# Patient Record
Sex: Male | Born: 1975 | Race: White | Hispanic: No | Marital: Married | Smoking: Current every day smoker
Health system: Southern US, Community
[De-identification: ages and names within clinical notes are randomized; demographics above are authoritative.]

---

## 2016-01-23 ENCOUNTER — Encounter: Payer: Self-pay | Admitting: Emergency Medicine

## 2016-01-23 ENCOUNTER — Emergency Department
Admission: EM | Admit: 2016-01-23 | Discharge: 2016-01-24 | Disposition: A | Discharge status: Home / Self Care | Payer: Self-pay | Attending: Emergency Medicine | Admitting: Emergency Medicine

## 2016-01-23 DIAGNOSIS — N390 Urinary tract infection, site not specified: Secondary | ICD-10-CM | POA: Insufficient documentation

## 2016-01-23 DIAGNOSIS — F17210 Nicotine dependence, cigarettes, uncomplicated: Secondary | ICD-10-CM | POA: Insufficient documentation

## 2016-01-23 DIAGNOSIS — N200 Calculus of kidney: Secondary | ICD-10-CM | POA: Insufficient documentation

## 2016-01-23 DIAGNOSIS — R109 Unspecified abdominal pain: Secondary | ICD-10-CM

## 2016-01-23 MED ORDER — HYDROMORPHONE HCL 1 MG/ML IJ SOLN
1.0000 mg | Freq: Once | INTRAMUSCULAR | Status: AC
  Administered 2016-01-24: 1 mg via INTRAVENOUS
  Filled 2016-01-23: qty 1

## 2016-01-23 MED ORDER — SODIUM CHLORIDE 0.9 % IV BOLUS (SEPSIS)
1000.0000 mL | Freq: Once | INTRAVENOUS | Status: AC
  Administered 2016-01-24: 1000 mL via INTRAVENOUS

## 2016-01-23 MED ORDER — ONDANSETRON HCL 4 MG/2ML IJ SOLN
4.0000 mg | Freq: Once | INTRAMUSCULAR | Status: AC
  Administered 2016-01-24: 4 mg via INTRAVENOUS
  Filled 2016-01-23: qty 2

## 2016-01-23 NOTE — ED Notes (Signed)
Patient here for severe abdominal pain. Difficulty ambulating due to pain.

## 2016-01-24 ENCOUNTER — Emergency Department: Payer: Self-pay

## 2016-01-24 LAB — CBC WITH DIFFERENTIAL/PLATELET
Basophils Absolute: 0 10*3/uL (ref 0–0.1)
Basophils Relative: 0 %
EOS PCT: 1 %
Eosinophils Absolute: 0.1 10*3/uL (ref 0–0.7)
HCT: 48.2 % (ref 40.0–52.0)
Hemoglobin: 16.9 g/dL (ref 13.0–18.0)
LYMPHS ABS: 3 10*3/uL (ref 1.0–3.6)
LYMPHS PCT: 28 %
MCH: 31 pg (ref 26.0–34.0)
MCHC: 35 g/dL (ref 32.0–36.0)
MCV: 88.4 fL (ref 80.0–100.0)
MONO ABS: 1 10*3/uL (ref 0.2–1.0)
MONOS PCT: 9 %
Neutro Abs: 6.7 10*3/uL — ABNORMAL HIGH (ref 1.4–6.5)
Neutrophils Relative %: 62 %
PLATELETS: 215 10*3/uL (ref 150–440)
RBC: 5.45 MIL/uL (ref 4.40–5.90)
RDW: 13.3 % (ref 11.5–14.5)
WBC: 10.8 10*3/uL — AB (ref 3.8–10.6)

## 2016-01-24 LAB — BASIC METABOLIC PANEL
Anion gap: 8 (ref 5–15)
BUN: 16 mg/dL (ref 6–20)
CO2: 23 mmol/L (ref 22–32)
CREATININE: 1.12 mg/dL (ref 0.61–1.24)
Calcium: 9.1 mg/dL (ref 8.9–10.3)
Chloride: 108 mmol/L (ref 101–111)
GFR calc Af Amer: >60 mL/min (ref >60)
GLUCOSE: 122 mg/dL — AB (ref 65–99)
POTASSIUM: 3.5 mmol/L (ref 3.5–5.1)
Sodium: 139 mmol/L (ref 135–145)

## 2016-01-24 LAB — URINALYSIS COMPLETE WITH MICROSCOPIC (ARMC ONLY)
Bacteria, UA: NONE SEEN
Bilirubin Urine: NEGATIVE
Glucose, UA: NEGATIVE mg/dL
Nitrite: NEGATIVE
PH: 5 (ref 5.0–8.0)
PROTEIN: NEGATIVE mg/dL
SQUAMOUS EPITHELIAL / LPF: NONE SEEN
Specific Gravity, Urine: 1.027 (ref 1.005–1.030)

## 2016-01-24 IMAGING — CT CT RENAL STONE PROTOCOL
3 of 4 series · 9 of 46 positions shown, 16 images · non-contrast
Comparison: None.

CLINICAL DATA: Acute onset of left flank pain, hematuria and
difficulty urinating. Initial encounter.

EXAM:
CT ABDOMEN AND PELVIS WITHOUT CONTRAST
TECHNIQUE: Multidetector CT imaging of the abdomen and pelvis was performed
following the standard protocol without IV contrast.

[Series 4: lung · axial · 0.68mm/px · z∈[-630,-555]mm · 5 of 23 slices shown, 10 images]
[im 4/23  soft-tissue]
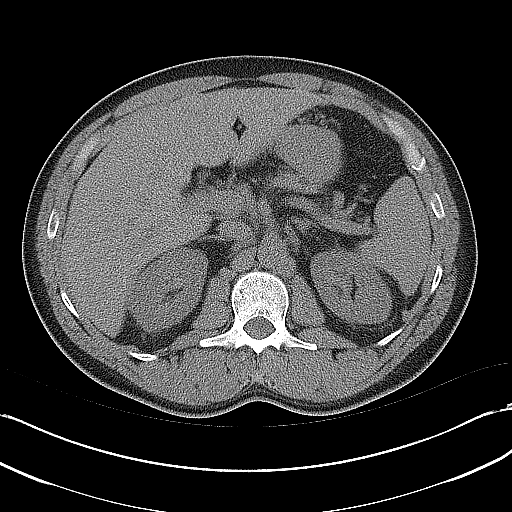
[im 4/23  bone]
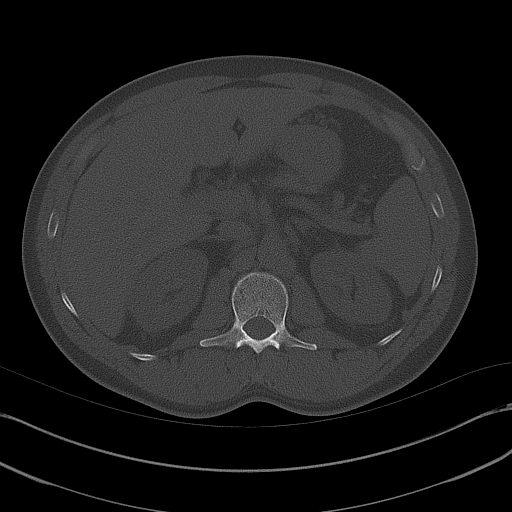
[im 8/23  soft-tissue]
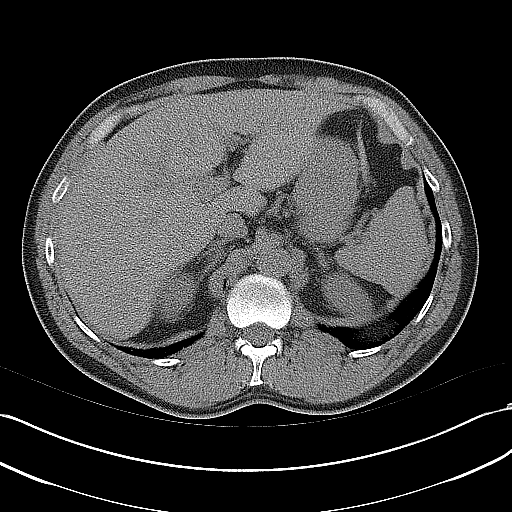
[im 8/23  lung]
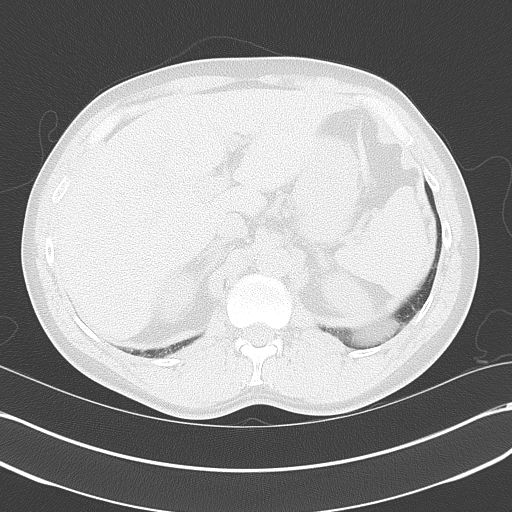
[im 12/23  soft-tissue]
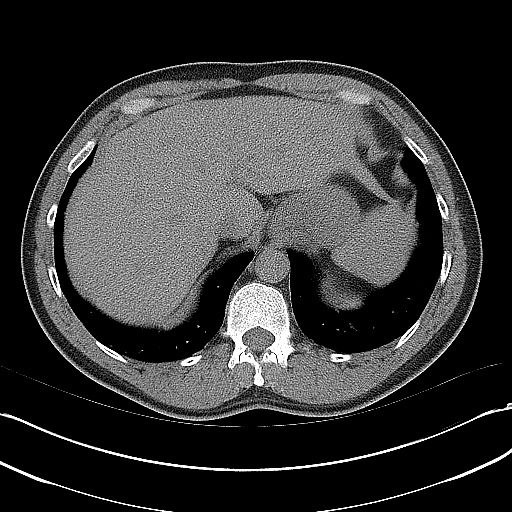
[im 12/23  lung]
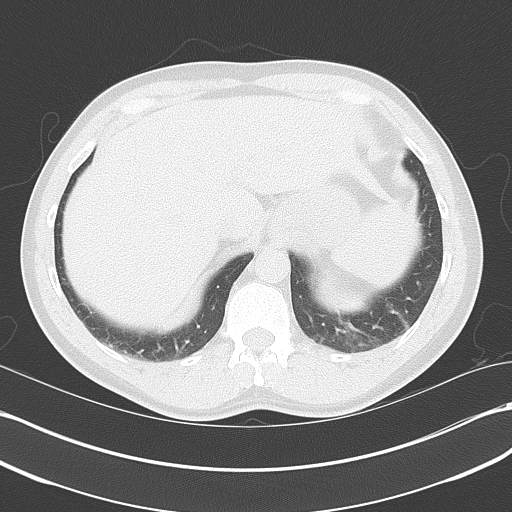
[im 15/23  soft-tissue]
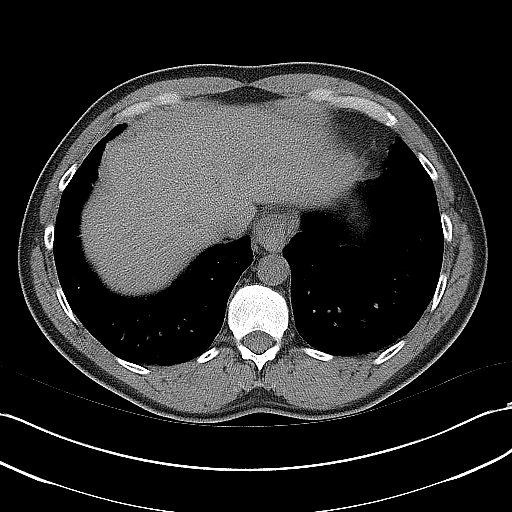
[im 15/23  lung]
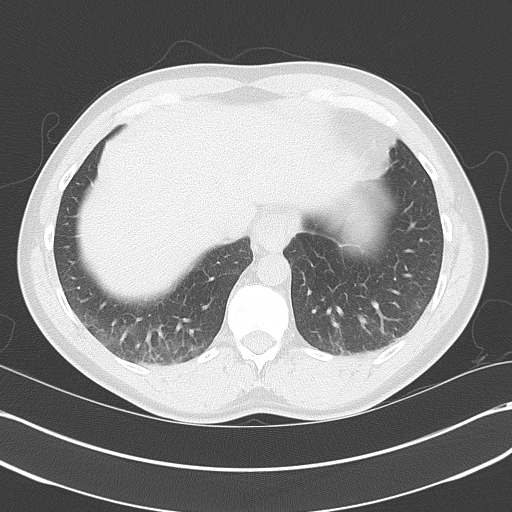
[im 19/23  soft-tissue]
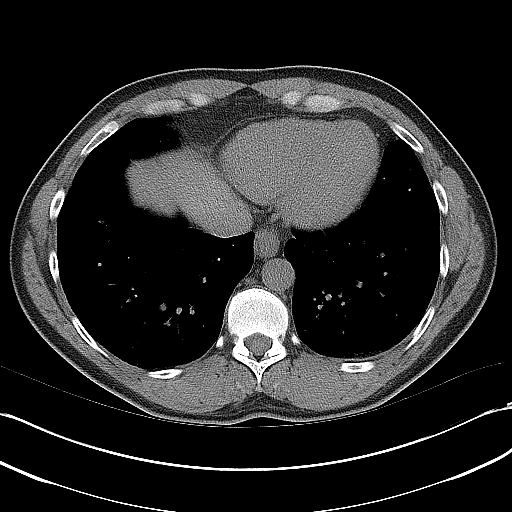
[im 19/23  lung]
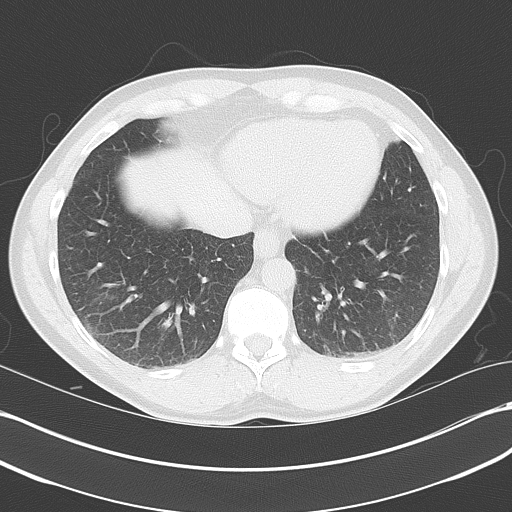

[Series 5: coronal · coronal · 0.71mm/px · 3 of 125 slices shown, 4 images]
[im 42/125  soft-tissue]
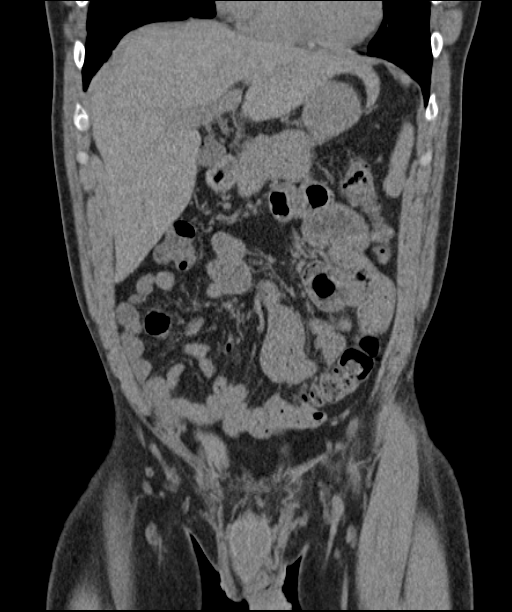
[im 56/125  soft-tissue]
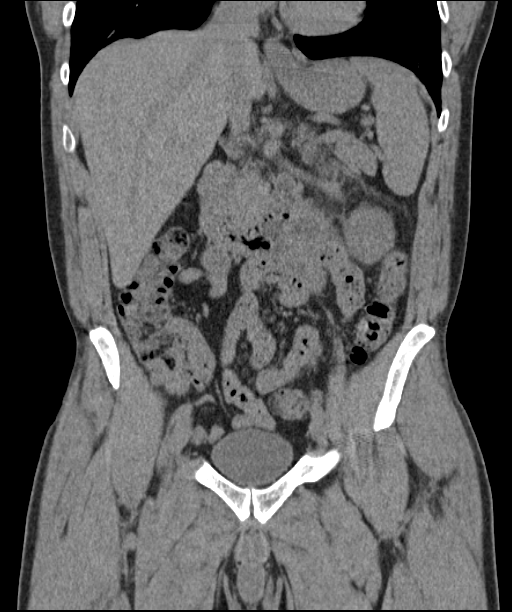
[im 56/125  bone]
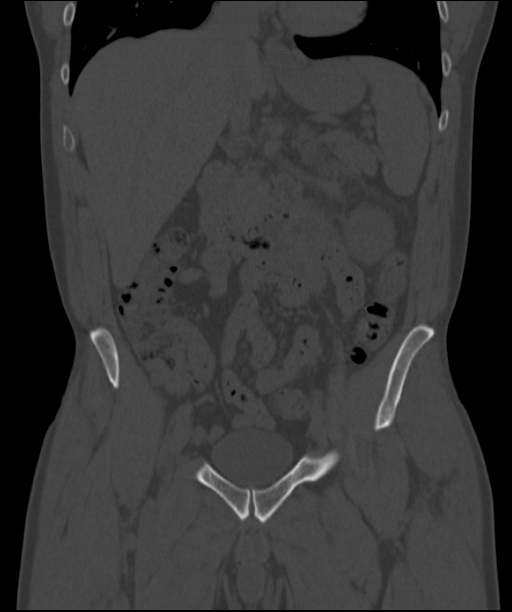
[im 69/125  soft-tissue]
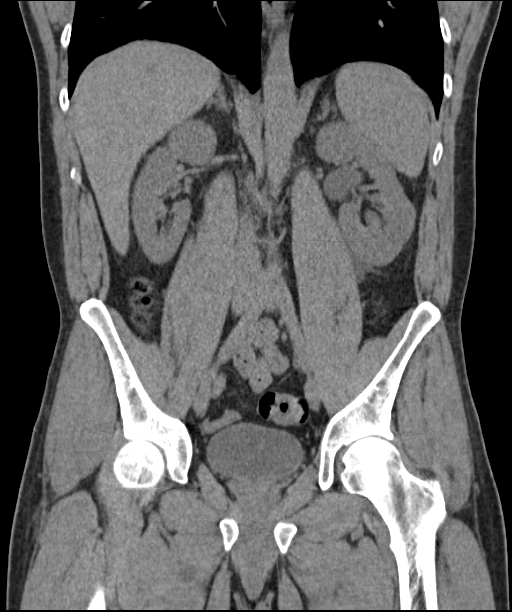

[Series 6: sagittal · sagittal · 0.54mm/px · 1 of 169 slices shown, 2 images]
[im 57/169  soft-tissue]
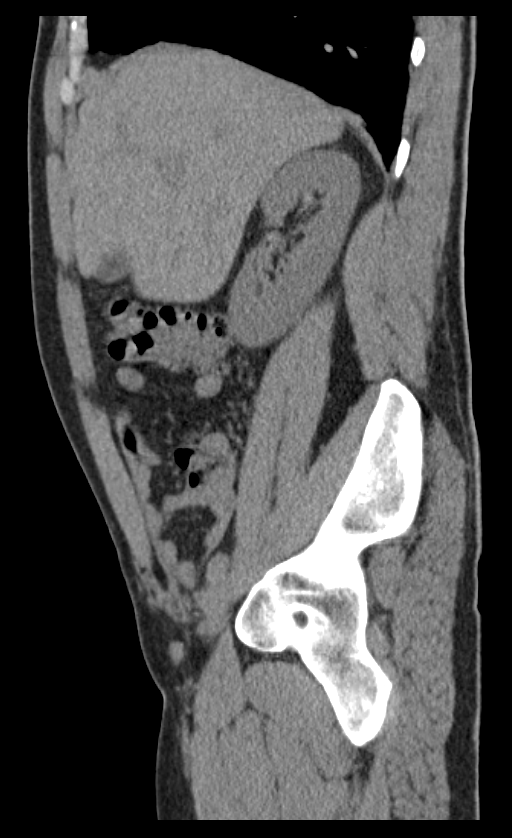
[im 57/169  bone]
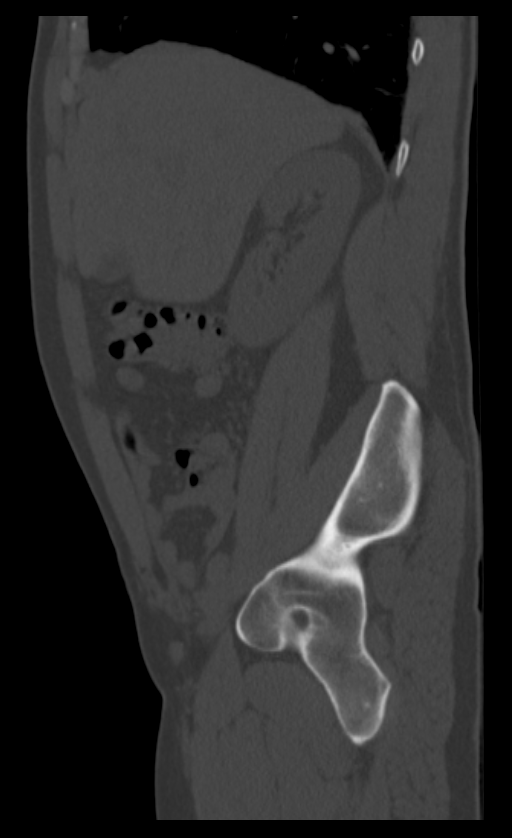

[9 of 46 positions shown; findings below may reference images not displayed]

FINDINGS: Minimal bibasilar atelectasis is noted.

A 5 mm hypodensity is noted within the right hepatic lobe. The liver
and spleen are otherwise unremarkable. The gallbladder is within
normal limits. The pancreas and adrenal glands are unremarkable.

Minimal left-sided hydronephrosis is noted, with prominence of the
left renal pelvis and left ureter, and an obstructing 5 x 3 mm stone
noted distally at the left vesicoureteral junction. Mild nonspecific
perinephric stranding is noted bilaterally. Nonobstructing bilateral
renal stones are seen, measuring up to 6 mm in size.

No free fluid is identified. The small bowel is unremarkable in
appearance. The stomach is within normal limits. No acute vascular
abnormalities are seen.

The appendix is normal in caliber, without evidence of appendicitis.
The colon is grossly unremarkable in appearance.

The bladder is moderately distended and grossly unremarkable. The
prostate remains normal in size. No inguinal lymphadenopathy is
seen.

No acute osseous abnormalities are identified.
IMPRESSION: 1. Minimal left-sided hydronephrosis, with an obstructing 5 x 3 mm
stone noted distally at the left vesicoureteral junction.
2. Nonobstructing bilateral renal stones, measuring up to 6 mm in
size.
3. 5 mm nonspecific hypodensity within the right hepatic lobe.

## 2016-01-24 MED ORDER — KETOROLAC TROMETHAMINE 30 MG/ML IJ SOLN
30.0000 mg | Freq: Once | INTRAMUSCULAR | Status: AC
  Administered 2016-01-24: 30 mg via INTRAVENOUS
  Filled 2016-01-24: qty 1

## 2016-01-24 MED ORDER — CIPROFLOXACIN HCL 500 MG PO TABS
500.0000 mg | ORAL_TABLET | Freq: Once | ORAL | Status: AC
  Administered 2016-01-24: 500 mg via ORAL
  Filled 2016-01-24: qty 1

## 2016-01-24 MED ORDER — SODIUM CHLORIDE 0.9 % IV BOLUS (SEPSIS)
1000.0000 mL | Freq: Once | INTRAVENOUS | Status: AC
  Administered 2016-01-24: 1000 mL via INTRAVENOUS

## 2016-01-24 MED ORDER — OXYCODONE-ACETAMINOPHEN 5-325 MG PO TABS: 1.0000 | ORAL_TABLET | ORAL | Status: DC | PRN

## 2016-01-24 MED ORDER — CIPROFLOXACIN HCL 500 MG PO TABS: 500.0000 mg | ORAL_TABLET | Freq: Two times a day (BID) | ORAL | Status: DC

## 2016-01-24 MED ORDER — TAMSULOSIN HCL 0.4 MG PO CAPS
0.4000 mg | ORAL_CAPSULE | Freq: Once | ORAL | Status: AC
  Administered 2016-01-24: 0.4 mg via ORAL
  Filled 2016-01-24: qty 1

## 2016-01-24 MED ORDER — HYDROMORPHONE HCL 1 MG/ML IJ SOLN
1.0000 mg | Freq: Once | INTRAMUSCULAR | Status: AC
  Administered 2016-01-24: 1 mg via INTRAVENOUS

## 2016-01-24 MED ORDER — ONDANSETRON 4 MG PO TBDP: 4.0000 mg | ORAL_TABLET | Freq: Three times a day (TID) | ORAL | Status: DC | PRN

## 2016-01-24 MED ORDER — HYDROMORPHONE HCL 1 MG/ML IJ SOLN
INTRAMUSCULAR | Status: AC
  Administered 2016-01-24: 1 mg via INTRAVENOUS
  Filled 2016-01-24: qty 1

## 2016-01-24 MED ORDER — TAMSULOSIN HCL 0.4 MG PO CAPS: 0.4000 mg | ORAL_CAPSULE | Freq: Every day | ORAL | Status: DC

## 2016-01-24 NOTE — Discharge Instructions (Signed)
1. Take pain & nausea medicines as needed (Percocet/Zofran #30). Make sure to take a stool softener while taking narcotic pain medicines. 2. Take Flomax 0.4mg  daily x 14 days. 3. Take antibiotic as prescribed (Cipro 500 mg twice daily 7 days). 4. Drink plenty of bottled or filtered water daily. 5. Return to the ER for worsening symptoms, persistent vomiting, fever, difficulty breathing or other concerns.  Flank Pain Flank pain refers to pain that is located on the side of the body between the upper abdomen and the back. The pain may occur over a short period of time (acute) or may be long-term or reoccurring (chronic). It may be mild or severe. Flank pain can be caused by many things. CAUSES  Some of the more common causes of flank pain include:  Muscle strains.   Muscle spasms.   A disease of your spine (vertebral disk disease).   A lung infection (pneumonia).   Fluid around your lungs (pulmonary edema).   A kidney infection.   Kidney stones.   A very painful skin rash caused by the chickenpox virus (shingles).   Gallbladder disease.  HOME CARE INSTRUCTIONS  Home care will depend on the cause of your pain. In general,  Rest as directed by your caregiver.  Drink enough fluids to keep your urine clear or pale yellow.  Only take over-the-counter or prescription medicines as directed by your caregiver. Some medicines may help relieve the pain.  Tell your caregiver about any changes in your pain.  Follow up with your caregiver as directed. SEEK IMMEDIATE MEDICAL CARE IF:   Your pain is not controlled with medicine.   You have new or worsening symptoms.  Your pain increases.   You have abdominal pain.   You have shortness of breath.   You have persistent nausea or vomiting.   You have swelling in your abdomen.   You feel faint or pass out.   You have blood in your urine.  You have a fever or persistent symptoms for more than 2-3 days.  You  have a fever and your symptoms suddenly get worse. MAKE SURE YOU:   Understand these instructions.  Will watch your condition.  Will get help right away if you are not doing well or get worse.   This information is not intended to replace advice given to you by your health care provider. Make sure you discuss any questions you have with your health care provider.   Document Released: 09/19/2005 Document Revised: 04/22/2012 Document Reviewed: 03/12/2012 Elsevier Interactive Patient Education 2016 Elsevier Inc[employer]  Kidney Stones Kidney stones (urolithiasis) are deposits that form inside your kidneys. The intense pain is caused by the stone moving through the urinary tract. When the stone moves, the ureter goes into spasm around the stone. The stone is usually passed in the urine.  CAUSES   A disorder that makes certain neck glands produce too much parathyroid hormone (primary hyperparathyroidism).  A buildup of uric acid crystals, similar to gout in your joints.  Narrowing (stricture) of the ureter.  A kidney obstruction present at birth (congenital obstruction).  Previous surgery on the kidney or ureters.  Numerous kidney infections. SYMPTOMS   Feeling sick to your stomach (nauseous).  Throwing up (vomiting).  Blood in the urine (hematuria).  Pain that usually spreads (radiates) to the groin.  Frequency or urgency of urination. DIAGNOSIS   Taking a history and physical exam.  Blood or urine tests.  CT scan.  Occasionally, an examination of the inside  of the urinary bladder (cystoscopy) is performed. TREATMENT   Observation.  Increasing your fluid intake.  Extracorporeal shock wave lithotripsy--This is a noninvasive procedure that uses shock waves to break up kidney stones.  Surgery may be needed if you have severe pain or persistent obstruction. There are various surgical procedures. Most of the procedures are performed with the use of small instruments. Only  small incisions are needed to accommodate these instruments, so recovery time is minimized. The size, location, and chemical composition are all important variables that will determine the proper choice of action for you. Talk to your health care provider to better understand your situation so that you will minimize the risk of injury to yourself and your kidney.  HOME CARE INSTRUCTIONS   Drink enough water and fluids to keep your urine clear or pale yellow. This will help you to pass the stone or stone fragments.  Strain all urine through the provided strainer. Keep all particulate matter and stones for your health care provider to see. The stone causing the pain may be as small as a grain of salt. It is very important to use the strainer each and every time you pass your urine. The collection of your stone will allow your health care provider to analyze it and verify that a stone has actually passed. The stone analysis will often identify what you can do to reduce the incidence of recurrences.  Only take over-the-counter or prescription medicines for pain, discomfort, or fever as directed by your health care provider.  Keep all follow-up visits as told by your health care provider. This is important.  Get follow-up X-rays if required. The absence of pain does not always mean that the stone has passed. It may have only stopped moving. If the urine remains completely obstructed, it can cause loss of kidney function or even complete destruction of the kidney. It is your responsibility to make sure X-rays and follow-ups are completed. Ultrasounds of the kidney can show blockages and the status of the kidney. Ultrasounds are not associated with any radiation and can be performed easily in a matter of minutes.  Make changes to your daily diet as told by your health care provider. You may be told to:  Limit the amount of salt that you eat.  Eat 5 or more servings of fruits and vegetables each  day.  Limit the amount of meat, poultry, fish, and eggs that you eat.  Collect a 24-hour urine sample as told by your health care provider.You may need to collect another urine sample every 6-12 months. SEEK MEDICAL CARE IF:  You experience pain that is progressive and unresponsive to any pain medicine you have been prescribed. SEEK IMMEDIATE MEDICAL CARE IF:   Pain cannot be controlled with the prescribed medicine.  You have a fever or shaking chills.  The severity or intensity of pain increases over 18 hours and is not relieved by pain medicine.  You develop a new onset of abdominal pain.  You feel faint or pass out.  You are unable to urinate.   This information is not intended to replace advice given to you by your health care provider. Make sure you discuss any questions you have with your health care provider.   Document Released: 07/29/2005 Document Revised: 04/19/2015 Document Reviewed: 12/30/2012 Elsevier Interactive Patient Education 2016 Elsevier Inc.  Urinary Tract Infection Urinary tract infections (UTIs) can develop anywhere along your urinary tract. Your urinary tract is your body's drainage system for removing wastes  and extra water. Your urinary tract includes two kidneys, two ureters, a bladder, and a urethra. Your kidneys are a pair of bean-shaped organs. Each kidney is about the size of your fist. They are located below your ribs, one on each side of your spine. CAUSES Infections are caused by microbes, which are microscopic organisms, including fungi, viruses, and bacteria. These organisms are so small that they can only be seen through a microscope. Bacteria are the microbes that most commonly cause UTIs. SYMPTOMS  Symptoms of UTIs may vary by age and gender of the patient and by the location of the infection. Symptoms in young women typically include a frequent and intense urge to urinate and a painful, burning feeling in the bladder or urethra during  urination. Older women and men are more likely to be tired, shaky, and weak and have muscle aches and abdominal pain. A fever may mean the infection is in your kidneys. Other symptoms of a kidney infection include pain in your back or sides below the ribs, nausea, and vomiting. DIAGNOSIS To diagnose a UTI, your caregiver will ask you about your symptoms. Your caregiver will also ask you to provide a urine sample. The urine sample will be tested for bacteria and white blood cells. White blood cells are made by your body to help fight infection. TREATMENT  Typically, UTIs can be treated with medication. Because most UTIs are caused by a bacterial infection, they usually can be treated with the use of antibiotics. The choice of antibiotic and length of treatment depend on your symptoms and the type of bacteria causing your infection. HOME CARE INSTRUCTIONS  If you were prescribed antibiotics, take them exactly as your caregiver instructs you. Finish the medication even if you feel better after you have only taken some of the medication.  Drink enough water and fluids to keep your urine clear or pale yellow.  Avoid caffeine, tea, and carbonated beverages. They tend to irritate your bladder.  Empty your bladder often. Avoid holding urine for long periods of time.  Empty your bladder before and after sexual intercourse.  After a bowel movement, women should cleanse from front to back. Use each tissue only once. SEEK MEDICAL CARE IF:   You have back pain.  You develop a fever.  Your symptoms do not begin to resolve within 3 days. SEEK IMMEDIATE MEDICAL CARE IF:   You have severe back pain or lower abdominal pain.  You develop chills.  You have nausea or vomiting.  You have continued burning or discomfort with urination. MAKE SURE YOU:   Understand these instructions.  Will watch your condition.  Will get help right away if you are not doing well or get worse.   This information is  not intended to replace advice given to you by your health care provider. Make sure you discuss any questions you have with your health care provider.   Document Released: 05/08/2005 Document Revised: 04/19/2015 Document Reviewed: 09/06/2011 Elsevier Interactive Patient Education [employer]2016 Elsevier Inc.

## 2016-01-24 NOTE — ED Notes (Signed)
Pt arrived to room by ambulation, steady gait, from triage with c/o blood in urine, left flank pain, and unable to urinate x 2 days. Pt reports he has hx of kidney stones.

## 2016-01-24 NOTE — ED Provider Notes (Signed)
[facility] Emergency Department Provider Note   ____________________________________________  Time seen: Approximately 1:30 AM  I have reviewed the triage vital signs and the nursing notes.   HISTORY  Chief Complaint Flank Pain    HPI Steven Smith is a 40 y.o. male who presents to the ED from home with a chief complaint of left flank pain. Patient reports onset of left flank pain 2 days ago with associated hematuria and urinary hesitancy. States he was "up all night" last night with pain and vomiting. History of kidney stones. Denies associated fever, chills, chest pain, shortness of breath, diarrhea. Denies recent travel or trauma. Nothing makes his pain better or worse.   Past medical history Kidney stones Hernia  There are no active problems to display for this patient.   History reviewed. No pertinent past surgical history.  Current Outpatient Rx  Name  Route  Sig  Dispense  Refill  . ibuprofen (ADVIL,MOTRIN) 200 MG tablet   Oral   Take 800 mg by mouth every 6 (six) hours as needed for moderate pain.           Allergies Review of patient's allergies indicates no known allergies.  History reviewed. No pertinent family history.  Social History Social History  Substance Use Topics  . Smoking status: Current Every Day Smoker    Types: Cigars  . Smokeless tobacco: None  . Alcohol Use: Yes    Review of Systems  Constitutional: No fever/chills. Eyes: No visual changes. ENT: No sore throat. Cardiovascular: Denies chest pain. Respiratory: Denies shortness of breath. Gastrointestinal: Positive for left flank and abdominal pain, nausea, and vomiting.  No diarrhea.  No constipation. Genitourinary: Positive for hematuria and hesitancy. Negative for dysuria. Musculoskeletal: Negative for back pain. Skin: Negative for rash. Neurological: Negative for headaches, focal weakness or numbness.  10-point ROS otherwise  negative.  ____________________________________________   PHYSICAL EXAM:  VITAL SIGNS: ED Triage Vitals  Enc Vitals Group     BP 01/23/16 2345 150/101 mmHg     Pulse Rate 01/23/16 2345 92     Resp 01/23/16 2346 15     Temp 01/23/16 2346 97.6 F (36.4 C)     Temp Source 01/23/16 2346 Oral     SpO2 01/23/16 2345 98 %     Weight 01/23/16 2347 154 lb (69.854 kg)     Height 01/23/16 2347 5\' 9"  (1.753 m)     Head Cir --      Peak Flow --      Pain Score 01/23/16 2346 10     Pain Loc --      Pain Edu? --      Excl. in GC? --    Examined after administration of IV Dilaudid: Constitutional: Alert and oriented. Well appearing and in no acute distress. Eyes: Conjunctivae are normal. PERRL. EOMI. Head: Atraumatic. Nose: No congestion/rhinnorhea. Mouth/Throat: Mucous membranes are moist.  Oropharynx non-erythematous. Neck: No stridor.   Cardiovascular: Normal rate, regular rhythm. Grossly normal heart sounds.  Good peripheral circulation. Respiratory: Normal respiratory effort.  No retractions. Lungs CTAB. Gastrointestinal: Soft and nontender. No distention. No abdominal bruits. Mild left CVA tenderness. Musculoskeletal: No lower extremity tenderness nor edema.  No joint effusions. Neurologic:  Normal speech and language. No gross focal neurologic deficits are appreciated. No gait instability. Skin:  Skin is warm, dry and intact. No rash noted. Psychiatric: Mood and affect are normal. Speech and behavior are normal.  ____________________________________________   LABS (all labs ordered are listed,  but only abnormal results are displayed)  Labs Reviewed  CBC WITH DIFFERENTIAL/PLATELET - Abnormal; Notable for the following:    WBC 10.8 (*)    Neutro Abs 6.7 (*)    All other components within normal limits  BASIC METABOLIC PANEL - Abnormal; Notable for the following:    Glucose, Bld 122 (*)    All other components within normal limits  URINALYSIS COMPLETEWITH MICROSCOPIC ([facility]  ONLY) - Abnormal; Notable for the following:    Color, Urine YELLOW (*)    APPearance CLEAR (*)    Ketones, ur TRACE (*)    Hgb urine dipstick 2+ (*)    Leukocytes, UA TRACE (*)    All other components within normal limits   ____________________________________________  EKG  None ____________________________________________  RADIOLOGY  CT renal stone study interpreted per Dr. Cherly Hensen: 1. Minimal left-sided hydronephrosis, with an obstructing 5 x 3 mm stone noted distally at the left vesicoureteral junction. 2. Nonobstructing bilateral renal stones, measuring up to 6 mm in size. 3. 5 mm nonspecific hypodensity within the right hepatic lobe. ____________________________________________   PROCEDURES  Procedure(s) performed: None  Critical Care performed: No  ____________________________________________   INITIAL IMPRESSION / ASSESSMENT AND PLAN / ED COURSE  Pertinent labs & imaging results that were available during my care of the patient were reviewed by me and considered in my medical decision making (see chart for details).  40 year old male who presents with left flank pain and hematuria. Symptoms suggestive of kidney stone. Will initiate IV fluid resuscitation, IV analgesia, and obtain CT renal colic study.  ----------------------------------------- 3:07 AM on 01/24/2016 -----------------------------------------  Patient resting in no acute distress. Updated patient of CT imaging study. Second liter IV fluids infusing. Awaiting urine specimen. Will go ahead and administer Flomax and Toradol at this time.  ----------------------------------------- 4:43 AM on 01/24/2016 -----------------------------------------  Updated patient of urinalysis results. Will place patient on Cipro. He will follow up with urology. Strict return precautions given. Patient verbalizes understanding and agrees with plan of care. ____________________________________________   FINAL  CLINICAL IMPRESSION(S) / ED DIAGNOSES  Final diagnoses:  Flank pain  Kidney stone  UTI (lower urinary tract infection)      NEW MEDICATIONS STARTED DURING THIS VISIT:  New Prescriptions   No medications on file     Note:  This document was prepared using Dragon voice recognition software and may include unintentional dictation errors.    Irean Hong, MD 01/24/16 [phone]

## 2016-01-24 NOTE — ED Notes (Signed)
Patient transported to CT 

## 2019-03-23 ENCOUNTER — Emergency Department: Payer: Managed Care, Other (non HMO)

## 2019-03-23 ENCOUNTER — Other Ambulatory Visit: Payer: Self-pay

## 2019-03-23 ENCOUNTER — Emergency Department
Admission: EM | Admit: 2019-03-23 | Discharge: 2019-03-23 | Disposition: A | Discharge status: Home / Self Care | Payer: Managed Care, Other (non HMO) | Attending: Emergency Medicine | Admitting: Emergency Medicine

## 2019-03-23 DIAGNOSIS — F17210 Nicotine dependence, cigarettes, uncomplicated: Secondary | ICD-10-CM | POA: Insufficient documentation

## 2019-03-23 DIAGNOSIS — Z20828 Contact with and (suspected) exposure to other viral communicable diseases: Secondary | ICD-10-CM | POA: Diagnosis not present

## 2019-03-23 DIAGNOSIS — R569 Unspecified convulsions: Secondary | ICD-10-CM | POA: Insufficient documentation

## 2019-03-23 LAB — CBC WITH DIFFERENTIAL/PLATELET
Abs Immature Granulocytes: 0.1 10*3/uL — ABNORMAL HIGH (ref 0.00–0.07)
Basophils Absolute: 0.1 10*3/uL (ref 0.0–0.1)
Basophils Relative: 1 %
Eosinophils Absolute: 0.1 10*3/uL (ref 0.0–0.5)
Eosinophils Relative: 0 %
HCT: 49.1 % (ref 39.0–52.0)
Hemoglobin: 17.4 g/dL — ABNORMAL HIGH (ref 13.0–17.0)
Immature Granulocytes: 1 %
Lymphocytes Relative: 12 %
Lymphs Abs: 1.4 10*3/uL (ref 0.7–4.0)
MCH: 32.8 pg (ref 26.0–34.0)
MCHC: 35.4 g/dL (ref 30.0–36.0)
MCV: 92.6 fL (ref 80.0–100.0)
Monocytes Absolute: 1.4 10*3/uL — ABNORMAL HIGH (ref 0.1–1.0)
Monocytes Relative: 12 %
Neutro Abs: 8.4 10*3/uL — ABNORMAL HIGH (ref 1.7–7.7)
Neutrophils Relative %: 74 %
Platelets: 198 10*3/uL (ref 150–400)
RBC: 5.3 MIL/uL (ref 4.22–5.81)
RDW: 12.4 % (ref 11.5–15.5)
WBC: 11.4 10*3/uL — ABNORMAL HIGH (ref 4.0–10.5)
nRBC: 0 % (ref 0.0–0.2)

## 2019-03-23 LAB — COMPREHENSIVE METABOLIC PANEL
ALT: 86 U/L — ABNORMAL HIGH (ref 0–44)
AST: 93 U/L — ABNORMAL HIGH (ref 15–41)
Albumin: 4.4 g/dL (ref 3.5–5.0)
Alkaline Phosphatase: 128 U/L — ABNORMAL HIGH (ref 38–126)
Anion gap: 15 (ref 5–15)
BUN: 18 mg/dL (ref 6–20)
CO2: 25 mmol/L (ref 22–32)
Calcium: 9.2 mg/dL (ref 8.9–10.3)
Chloride: 95 mmol/L — ABNORMAL LOW (ref 98–111)
Creatinine, Ser: 1.49 mg/dL — ABNORMAL HIGH (ref 0.61–1.24)
GFR calc Af Amer: >60 mL/min (ref >60)
GFR calc non Af Amer: 57 mL/min — ABNORMAL LOW (ref >60)
Glucose, Bld: 142 mg/dL — ABNORMAL HIGH (ref 70–99)
Potassium: 2.5 mmol/L — CL (ref 3.5–5.1)
Sodium: 135 mmol/L (ref 135–145)
Total Bilirubin: 1.7 mg/dL — ABNORMAL HIGH (ref 0.3–1.2)
Total Protein: 7.9 g/dL (ref 6.5–8.1)

## 2019-03-23 LAB — SARS CORONAVIRUS 2 BY RT PCR (HOSPITAL ORDER, PERFORMED IN ~~LOC~~ HOSPITAL LAB): SARS Coronavirus 2: NEGATIVE

## 2019-03-23 LAB — ETHANOL: Alcohol, Ethyl (B): <10 mg/dL (ref <10)

## 2019-03-23 IMAGING — CT CT HEAD WITHOUT CONTRAST
3 of 4 series · 16 of 47 positions shown, 19 images · non-contrast
Comparison: None.

CLINICAL DATA: Seizure

EXAM:
CT HEAD WITHOUT CONTRAST
TECHNIQUE: Contiguous axial images were obtained from the base of the skull
through the vertex without intravenous contrast.

[Series 2: head wo · axial · 0.47mm/px · z∈[-144,-4]mm · 10 of 32 slices shown, 13 images]
[im 2/32  brain]
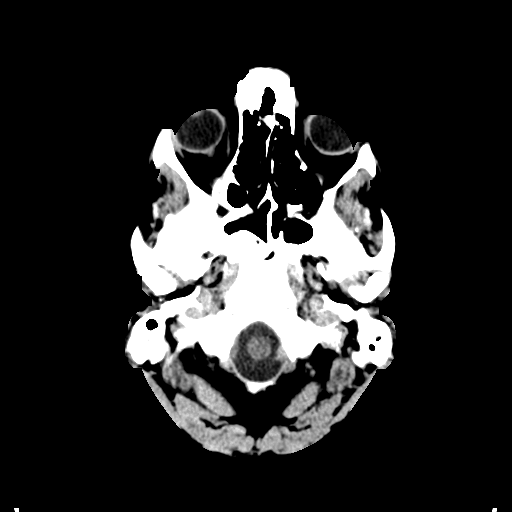
[im 2/32  bone]
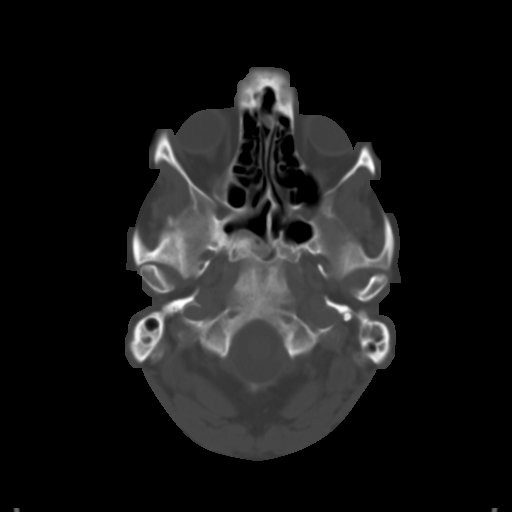
[im 5/32  brain]
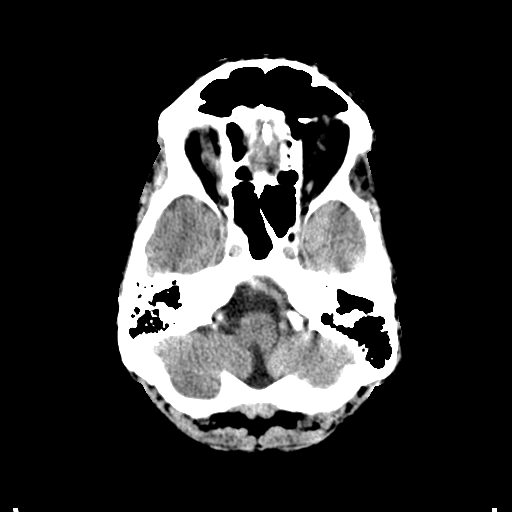
[im 8/32  brain]
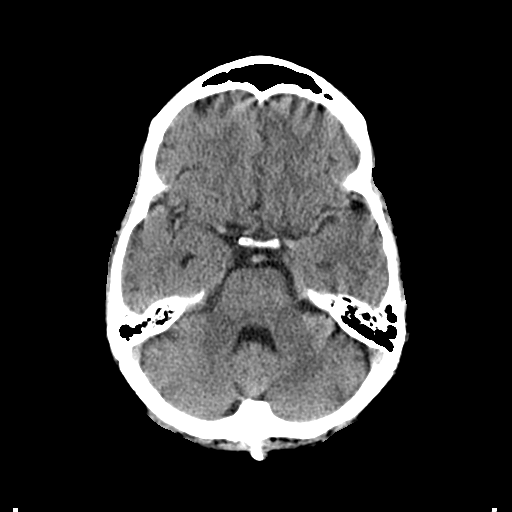
[im 11/32  brain]
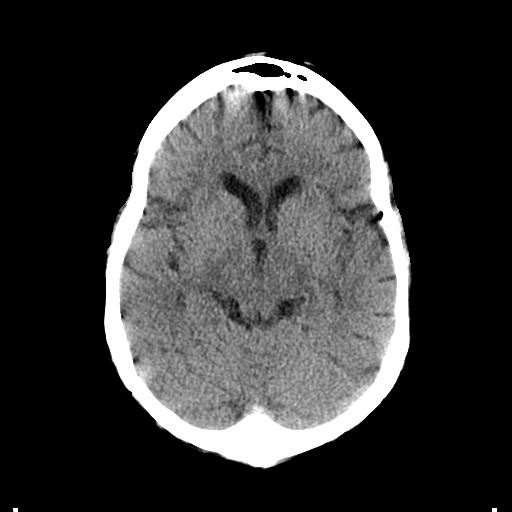
[im 14/32  brain]
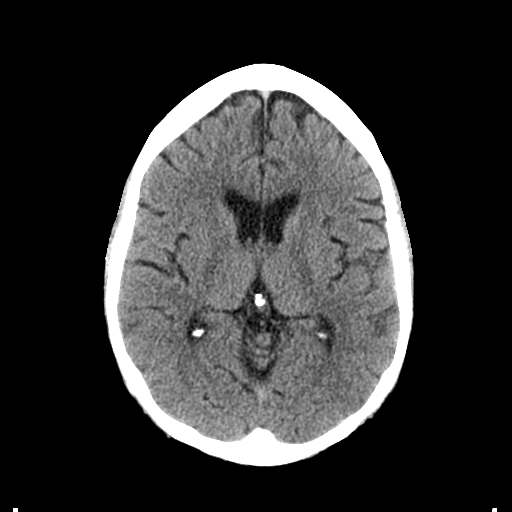
[im 14/32  bone]
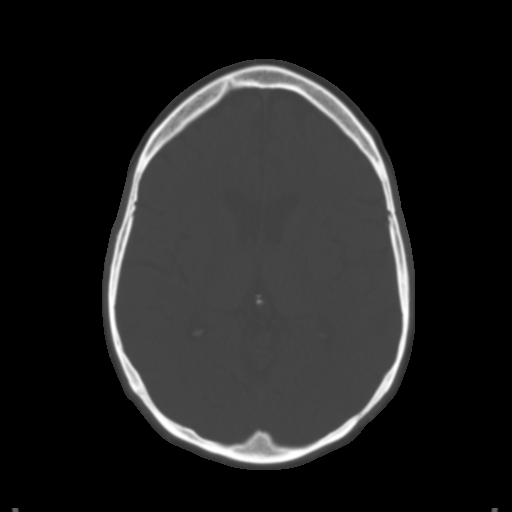
[im 18/32  brain]
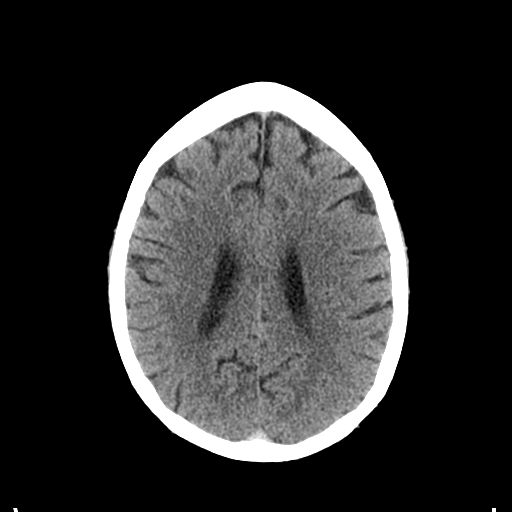
[im 21/32  brain]
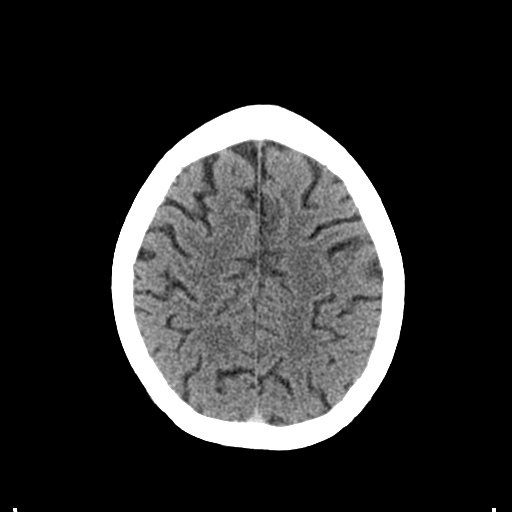
[im 24/32  brain]
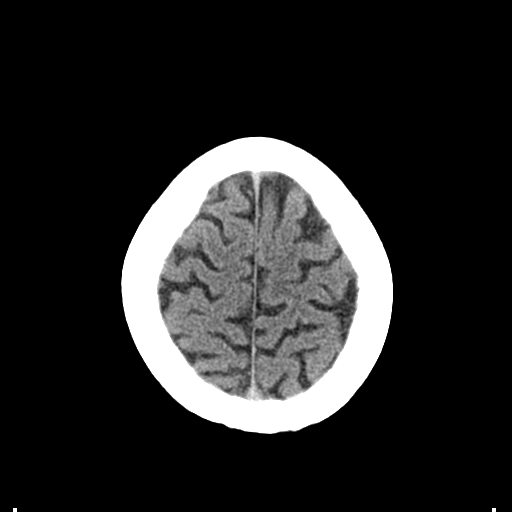
[im 27/32  brain]
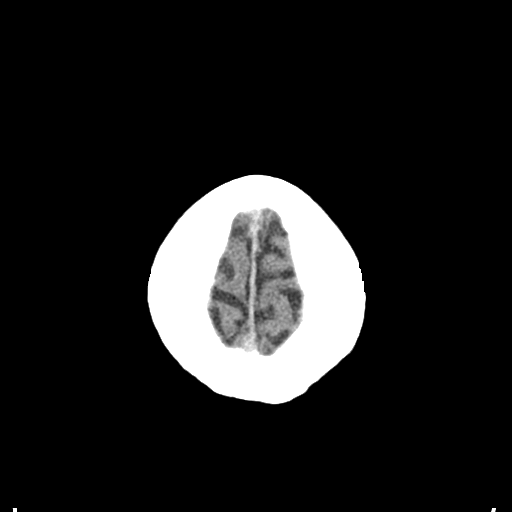
[im 27/32  bone]
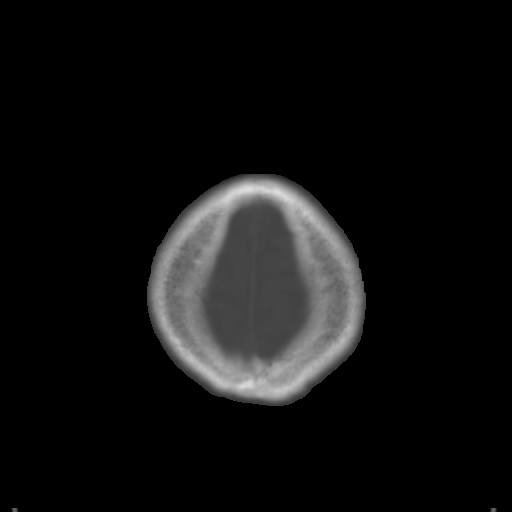
[im 30/32  brain]
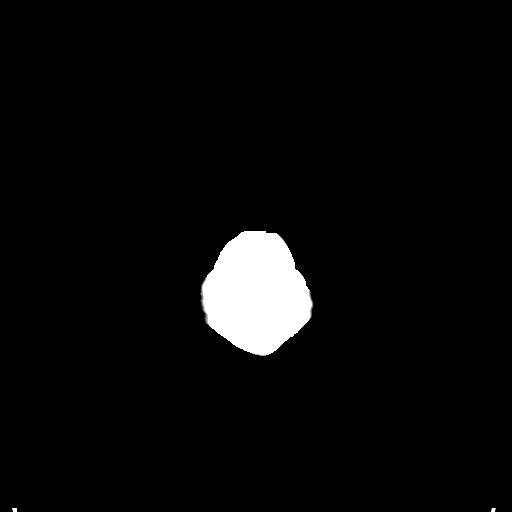

[Series 6: coronal soft tissue · coronal · 0.33mm/px · 3 of 69 slices shown]
[im 23/69  brain]
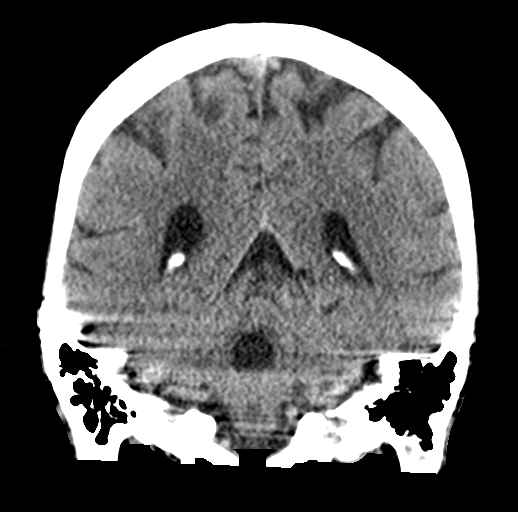
[im 31/69  brain]
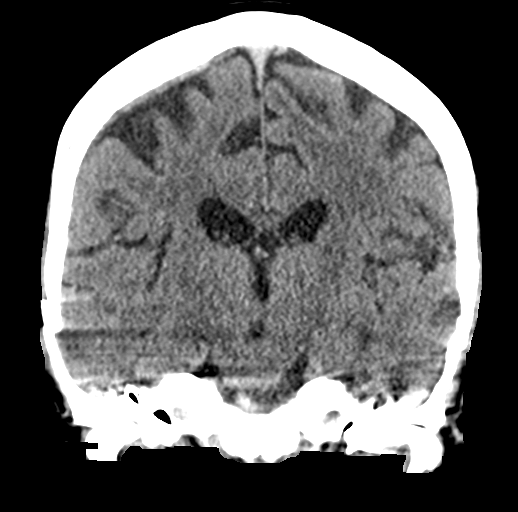
[im 38/69  brain]
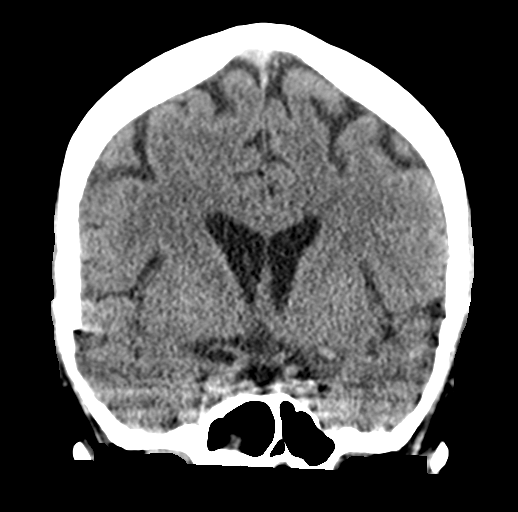

[Series 7: sagittal soft tissue · sagittal · 0.33mm/px · 3 of 55 slices shown]
[im 19/55  brain]
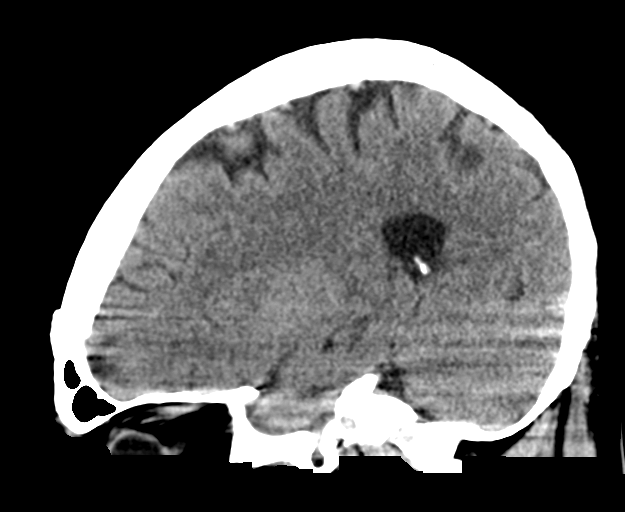
[im 28/55  brain]
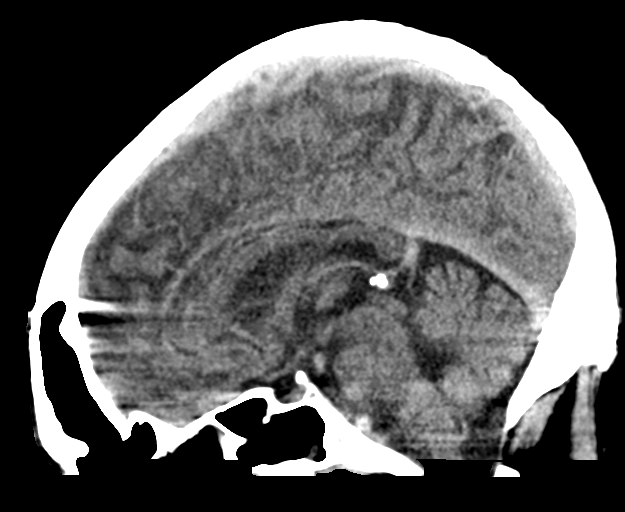
[im 37/55  brain]
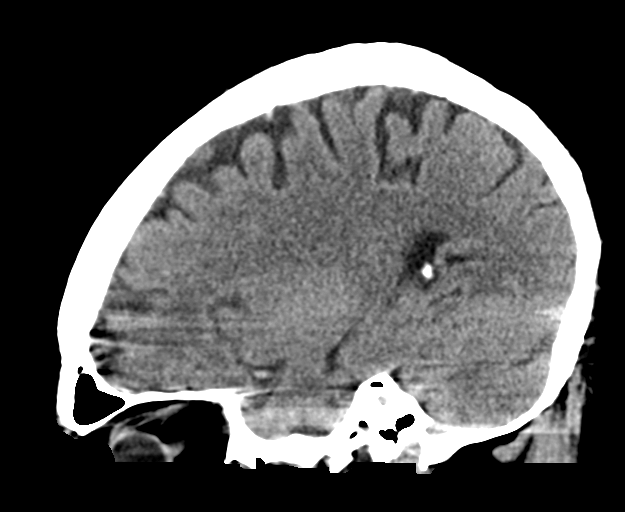

[16 of 47 positions shown; findings below may reference images not displayed]

FINDINGS: Brain: No evidence of acute territorial infarction, hemorrhage,
hydrocephalus,extra-axial collection or mass lesion/mass effect.
Normal gray-white differentiation. Ventricles are normal in size and
contour.

Vascular: No hyperdense vessel or unexpected calcification.

Skull: The skull is intact. No fracture or focal lesion identified.

Sinuses/Orbits: Fluid is seen within the right maxillary and
sphenoid air cells . The orbits and globes intact.

Other: None
IMPRESSION: No acute intracranial abnormality.

Fluid in the right maxillary and sphenoid air cells .

## 2019-03-23 MED ORDER — LORAZEPAM 2 MG/ML IJ SOLN
1.0000 mg | Freq: Once | INTRAMUSCULAR | Status: AC
  Administered 2019-03-23: 18:00:00 1 mg via INTRAVENOUS
  Filled 2019-03-23: qty 1

## 2019-03-23 MED ORDER — POTASSIUM CHLORIDE 20 MEQ/15ML (10%) PO SOLN
40.0000 meq | Freq: Once | ORAL | Status: AC
  Administered 2019-03-23: 40 meq via ORAL
  Filled 2019-03-23: qty 30

## 2019-03-23 MED ORDER — THIAMINE HCL 100 MG/ML IJ SOLN
Freq: Once | INTRAVENOUS | Status: AC
  Administered 2019-03-23: 17:00:00 via INTRAVENOUS
  Filled 2019-03-23: qty 0

## 2019-03-23 MED ORDER — LORAZEPAM 2 MG PO TABS
4.0000 mg | ORAL_TABLET | Freq: Once | ORAL | Status: AC
  Administered 2019-03-23: 19:00:00 4 mg via ORAL
  Filled 2019-03-23: qty 2

## 2019-03-23 MED ORDER — LORAZEPAM 2 MG/ML IJ SOLN
1.0000 mg | Freq: Once | INTRAMUSCULAR | Status: AC
  Administered 2019-03-23: 16:00:00 1 mg via INTRAVENOUS
  Filled 2019-03-23: qty 1

## 2019-03-23 NOTE — Discharge Instructions (Addendum)
You had your first seizure.  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  Please take the Ativan that we give you 1 this evening and then one half of 1 in the morning and the other half in the evening tomorrow.  This may help you to avoid further seizures.  Please follow-up with neurology.  Dr. Melrose Nakayama and Dr. Manuella Ghazi are two of the neurologists in town, they are both quite good they can make sure that this was an alcohol withdrawal seizure.  Until you see them I would avoid climbing ladders or driving.  Take showers but not baths is people have had seizures in the bath and drowned.  Do not go swimming.  Do not operate hazardous machinery like chainsaws.  I suggest going to [place] meetings as this may help keep you off the alcohol.  Going cold [place] repeatedly p seems to predispose toward further seizures if this was indeed an alcohol withdrawal seizure.  Please return for any further problems

## 2019-03-23 NOTE — ED Triage Notes (Signed)
Pt arrived via [facility] from [facility]. EMS reports that the pt works at [employer] and his co-workers found him having a seizure today while working, the pt was post-ictal upon EMS arrival but quickly became coherent and told them that he recently quit drinking 3 or 4 days ago. The pt denies any history of seizures and reports drinking about 1/5th of liquor daily for the past few weeks. Pt is AOx4, vss, he denies any pain, headache, numbness, facial weakness, unilateral weakness, palpitations or chest pain at this time.

## 2019-03-23 NOTE — ED Provider Notes (Addendum)
[facility] Emergency Department Provider Note   ____________________________________________   First MD Initiated Contact with Patient 03/23/19 1508     (approximate)  I have reviewed the triage vital signs and the nursing notes.   HISTORY  Chief Complaint Seizures   HPI Steven Smith is a 43 y.o. male who reports he stopped drinking 2 to 3 days ago and was found by his supervisor to be having a seizure.  History was obtained from EMS.  Patient does not remember the seizure.  I did not speak to EMS myself and I am not sure exactly what kind of seizure was but I believe it was tonic-clonic.  Patient has no prior history of seizures no family history of seizures.  He has gone cold [place] in an attempt to withdraw from alcohol several times in the past is never had a problem until now.        History reviewed. No pertinent past medical history.  There are no active problems to display for this patient.   History reviewed. No pertinent surgical history.  Prior to Admission medications   Not on File    Allergies Patient has no known allergies.  History reviewed. No pertinent family history.  Social History Social History   Tobacco Use  . Smoking status: Current Every Day Smoker    Types: Cigars  . Smokeless tobacco: Current User  Substance Use Topics  . Alcohol use: Yes    Alcohol/week: 7.0 standard drinks    Types: 7 Shots of liquor per week  . Drug use: Not on file    Review of Systems  Constitutional: No fever/chills Eyes: No visual changes. ENT: No sore throat. Cardiovascular: Denies chest pain. Respiratory: Denies shortness of breath. Gastrointestinal: No abdominal pain.  No nausea, no vomiting.  No diarrhea.  No constipation. Genitourinary: Negative for dysuria. Musculoskeletal: Negative for back pain. Skin: Negative for rash. Neurological: Negative for headaches, focal weakness     ____________________________________________   PHYSICAL EXAM:  VITAL SIGNS: ED Triage Vitals  Enc Vitals Group     BP 03/23/19 1506 119/85     Pulse Rate 03/23/19 1506 100     Resp 03/23/19 1506 (!) 23     Temp 03/23/19 1519 98.1 F (36.7 C)     Temp Source 03/23/19 1519 Oral     SpO2 03/23/19 1506 97 %     Weight --      Height --      Head Circumference --      Peak Flow --      Pain Score 03/23/19 1519 0     Pain Loc --      Pain Edu? --      Excl. in GC? --     Constitutional: Alert and oriented. Well appearing and in no acute distress. Eyes: Conjunctivae are normal.  Head: Atraumatic. Nose: No congestion/rhinnorhea. Mouth/Throat: Mucous membranes are moist.  Oropharynx non-erythematous. Neck: No stridor.  Cardiovascular: Normal rate, regular rhythm. Grossly normal heart sounds.  Good peripheral circulation. Respiratory: Normal respiratory effort.  No retractions. Lungs CTAB. Gastrointestinal: Soft and nontender. No distention. No abdominal bruits. No CVA tenderness. Musculoskeletal: No lower extremity tenderness nor edema.   Neurologic:  Normal speech and language. No gross focal neurologic deficits are appreciated.  Cranial nerves II through XII are intact although visual fields were not checked cerebellar finger-to-nose and heel-to-shin were normal rapid alternating movements and hands are normal motor strength is 5/5 throughout patient does not report  any numbness Skin:  Skin is warm, dry and intact. No rash noted. Psychiatric: Mood and affect are normal. Speech and behavior are normal.  ____________________________________________   LABS (all labs ordered are listed, but only abnormal results are displayed)  Labs Reviewed  CBC WITH DIFFERENTIAL/PLATELET - Abnormal; Notable for the following components:      Result Value   WBC 11.4 (*)    Hemoglobin 17.4 (*)    Neutro Abs 8.4 (*)    Monocytes Absolute 1.4 (*)    Abs Immature Granulocytes 0.10 (*)    All  other components within normal limits  COMPREHENSIVE METABOLIC PANEL - Abnormal; Notable for the following components:   Potassium 2.5 (*)    Chloride 95 (*)    Glucose, Bld 142 (*)    Creatinine, Ser 1.49 (*)    AST 93 (*)    ALT 86 (*)    Alkaline Phosphatase 128 (*)    Total Bilirubin 1.7 (*)    GFR calc non Af Amer 57 (*)    All other components within normal limits  SARS CORONAVIRUS 2 (HOSPITAL ORDER, [place] [facility])  ETHANOL  URINE DRUG SCREEN, QUALITATIVE ([facility] ONLY)   ____________________________________________  EKG   ____________________________________________  RADIOLOGY  ED MD interpretation: CT of the head read by radiology reviewed by me only shows some fluid in the sinuses  Official radiology report(s): Ct Head Wo Contrast  Result Date: 03/23/2019 CLINICAL DATA:  Seizure EXAM: CT HEAD WITHOUT CONTRAST TECHNIQUE: Contiguous axial images were obtained from the base of the skull through the vertex without intravenous contrast. COMPARISON:  None. FINDINGS: Brain: No evidence of acute territorial infarction, hemorrhage, hydrocephalus,extra-axial collection or mass lesion/mass effect. Normal gray-white differentiation. Ventricles are normal in size and contour. Vascular: No hyperdense vessel or unexpected calcification. Skull: The skull is intact. No fracture or focal lesion identified. Sinuses/Orbits: Fluid is seen within the right maxillary and sphenoid air cells . The orbits and globes intact. Other: None IMPRESSION: No acute intracranial abnormality. Fluid in the right maxillary and sphenoid air cells . Electronically Signed   By: Prudencio Pair M.D.   On: 03/23/2019 16:49    ____________________________________________   PROCEDURES  Procedure(s) performed (including Critical Care):  Procedures   ____________________________________________   INITIAL IMPRESSION / ASSESSMENT AND PLAN / ED COURSE  Patient offered admission but declined.   We will give him some Ativan to try and keep him from having any further seizures.  We will have him follow-up with neurology.  I suggested AA for him as well so he does not continue to follow-up the wagon.  Otherwise he is medically clear.  He does not have any headache or fever so I will not give him any antibiotics.    Patient says he does not have access to a car and has trouble getting to the pharmacy of her we will try to give him some Ativan here to take home.         ____________________________________________   FINAL CLINICAL IMPRESSION(S) / ED DIAGNOSES  Final diagnoses:  Seizure [facility])     ED Discharge Orders    None       Note:  This document was prepared using Dragon voice recognition software and may include unintentional dictation errors.    Nena Polio, MD 03/23/19 1743    Nena Polio, MD 03/23/19 1743

## 2019-03-24 MED FILL — Thiamine HCl Inj 100 MG/ML: INTRAMUSCULAR | Qty: 2 | Status: AC

## 2019-03-24 MED FILL — Sodium Chloride IV Soln 0.9%: INTRAVENOUS | Qty: 1000 | Status: AC

## 2019-03-24 MED FILL — Multiple Vitamin IV Soln: INTRAVENOUS | Qty: 10 | Status: AC

## 2019-03-24 MED FILL — Folic Acid Inj 5 MG/ML: INTRAMUSCULAR | Qty: 0.2 | Status: AC

## 2019-03-29 ENCOUNTER — Other Ambulatory Visit: Payer: Self-pay | Admitting: Neurology

## 2019-03-29 DIAGNOSIS — R569 Unspecified convulsions: Secondary | ICD-10-CM

## 2019-04-08 ENCOUNTER — Ambulatory Visit
Admission: RE | Admit: 2019-04-08 | Discharge: 2019-04-08 | Disposition: A | Discharge status: Home / Self Care | Payer: Managed Care, Other (non HMO) | Source: Ambulatory Visit | Attending: Neurology | Admitting: Neurology

## 2019-04-08 ENCOUNTER — Other Ambulatory Visit: Payer: Self-pay

## 2019-04-08 DIAGNOSIS — R569 Unspecified convulsions: Secondary | ICD-10-CM | POA: Diagnosis not present

## 2019-04-08 IMAGING — MR MRI HEAD WITHOUT AND WITH CONTRAST
11 of 15 series · 36 of 48 positions shown · IV contrast (gadavist)
Comparison: Head CT [DATE]

CLINICAL DATA: Fall with seizure.  Alcohol withdrawal.

EXAM:
MRI HEAD WITHOUT AND WITH CONTRAST
TECHNIQUE: Multiplanar, multiecho pulse sequences of the brain and surrounding
structures were obtained without and with intravenous contrast.
CONTRAST:  6 mL Gadavist

[Series 5: T1 · sagittal · 5.0mm · 0.62mm/px · 1 of 25 slices shown (1 of 2)]
[im 1/25]
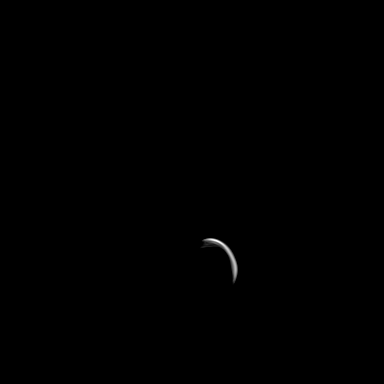

[Series 6: ax dwi_tracew · axial · 3.0mm · 0.60mm/px · z∈[-74,+81]mm · 3 of 48 slices shown]
[im 1/48]
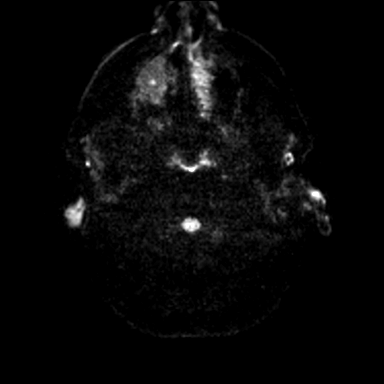
[im 24/48]
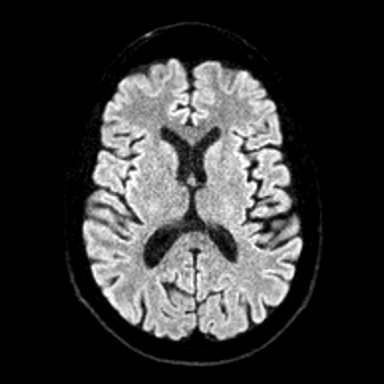
[im 48/48]
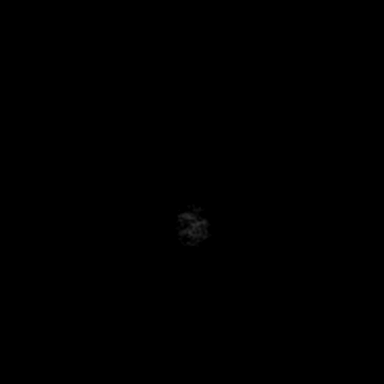

[Series 7: ax dwi_adc · axial · 3.0mm · 0.60mm/px · z∈[-74,+81]mm · 3 of 48 slices shown]
[im 1/48]
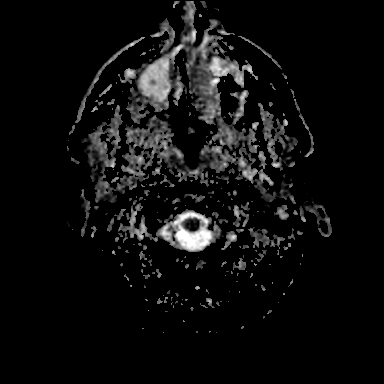
[im 24/48]
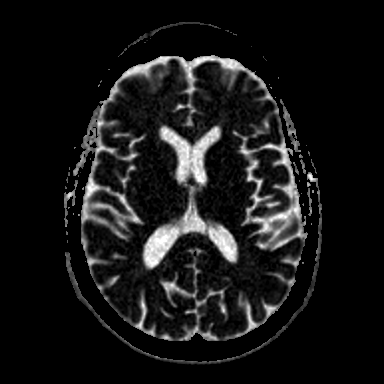
[im 48/48]
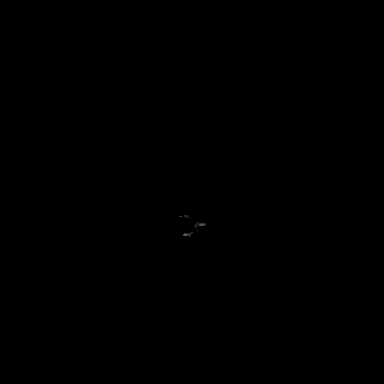

[Series 8: cor dwi_tracew · coronal · 5.0mm · 0.60mm/px · 2 of 38 slices shown]
[im 1/38]
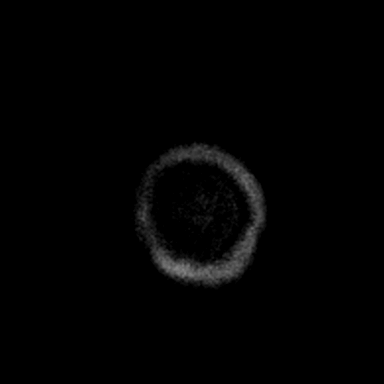
[im 38/38]
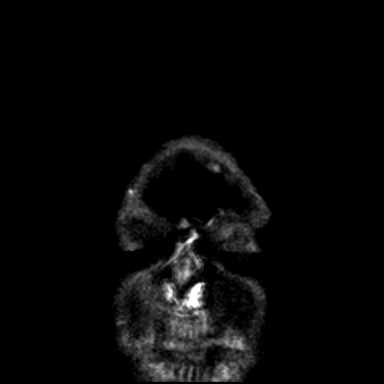

[Series 10: T2 · axial · 5.0mm · 0.53mm/px · 1 of 25 slices shown (1 of 2)]
[im 1/25]
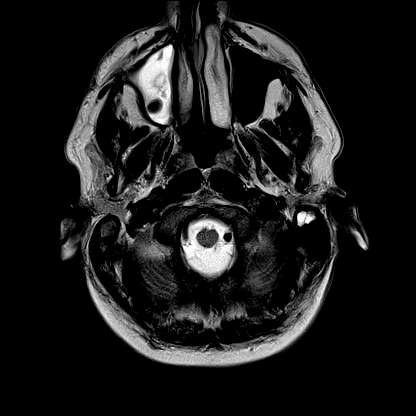

[Series 15: FLAIR · axial · 3.0mm · 0.53mm/px · z∈[-77,+84]mm · 3 of 55 slices shown (1 of 2)]
[im 1/55]
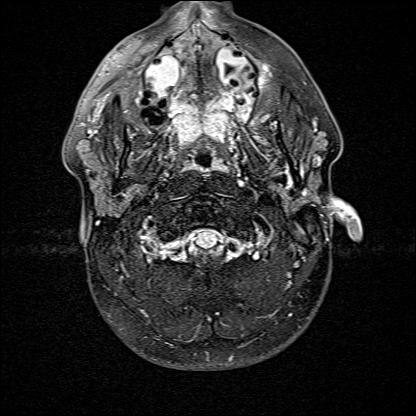
[im 28/55]
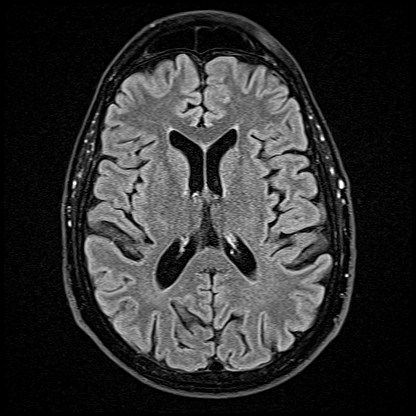
[im 55/55]
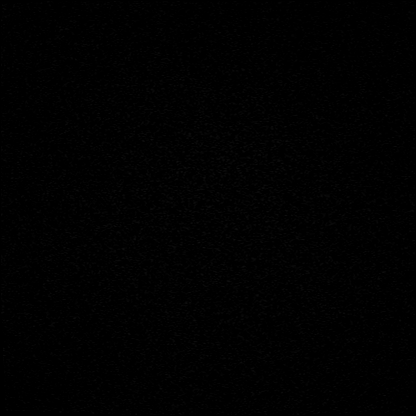

[Series 16: T1 · axial · 1.0mm · 0.98mm/px · z∈[-83,+91]mm · 8 of 176 slices shown (2 of 2)]
[im 1/176]
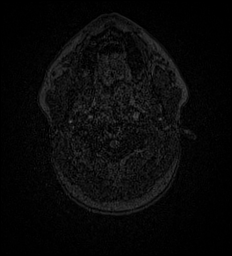
[im 22/176]
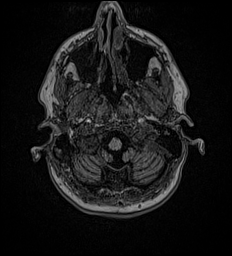
[im 44/176]
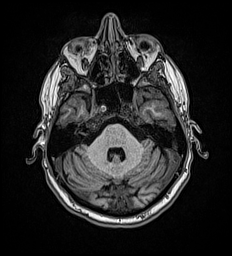
[im 66/176]
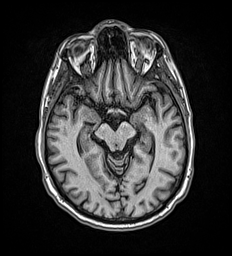
[im 110/176]
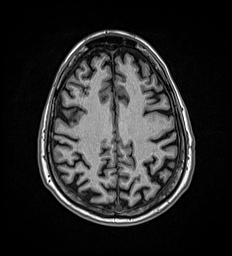
[im 132/176]
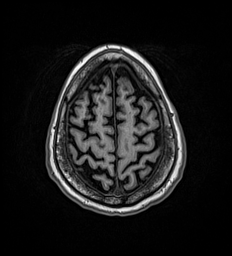
[im 154/176]
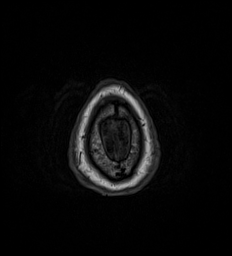
[im 176/176]
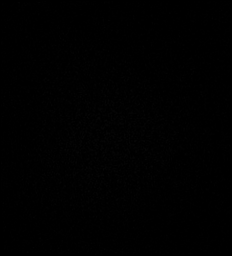

[Series 17: T2 · coronal · 3.0mm · 0.23mm/px · 2 of 35 slices shown (2 of 2)]
[im 1/35]
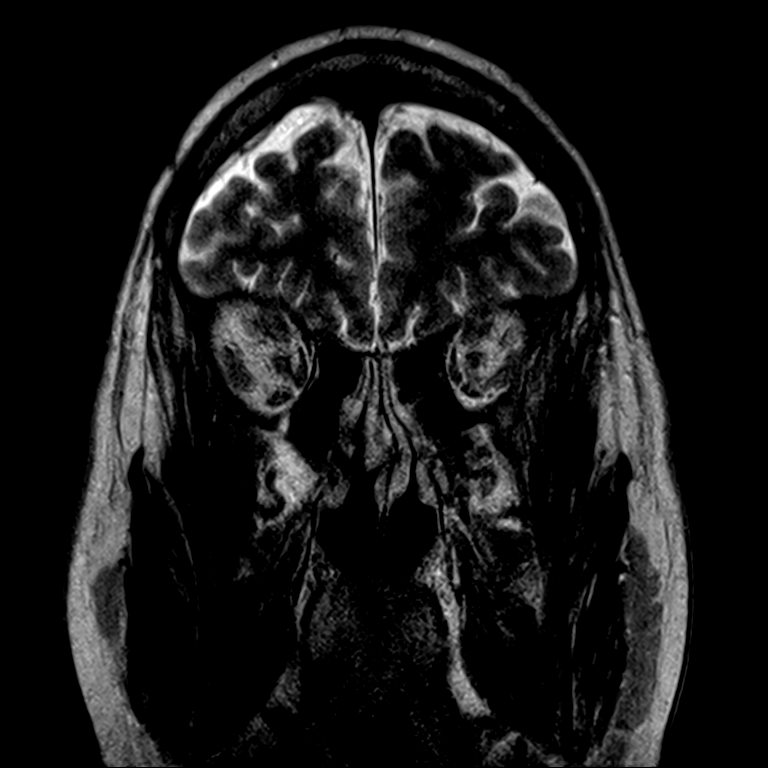
[im 35/35]
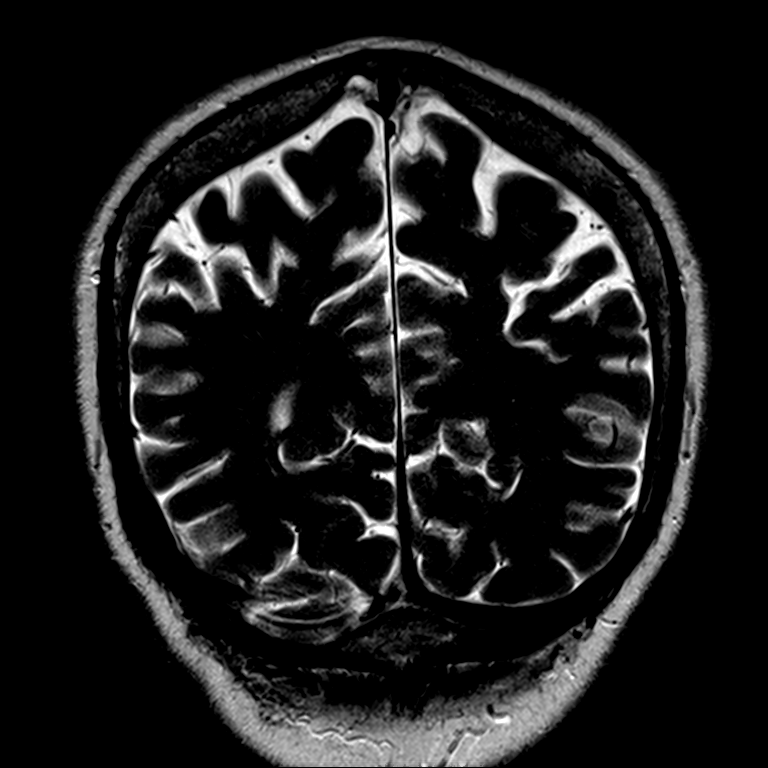

[Series 18: FLAIR · coronal · 3.0mm · 0.35mm/px · 2 of 35 slices shown (2 of 2)]
[im 1/35]
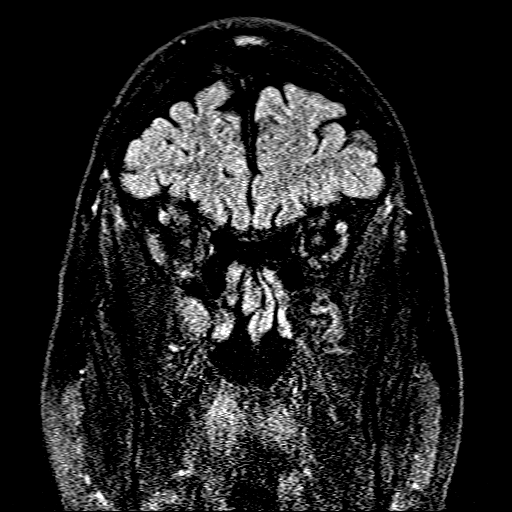
[im 35/35]
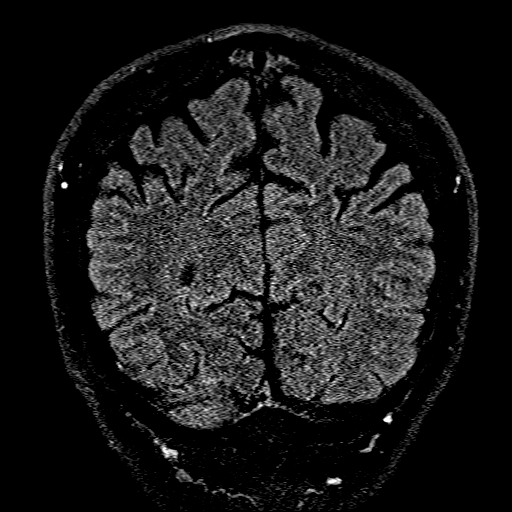

[Series 19: T1 post-contrast · axial · 1.0mm · 0.98mm/px · z∈[-83,+91]mm · 9 of 176 slices shown (1 of 2)]
[im 1/176]
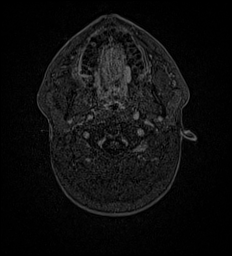
[im 22/176]
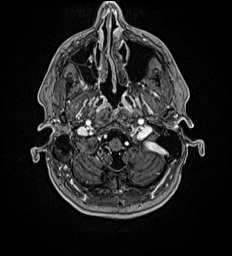
[im 44/176]
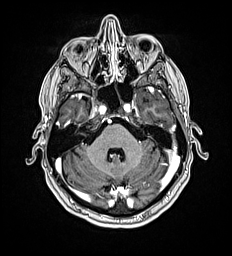
[im 66/176]
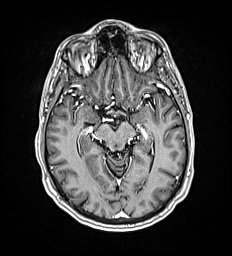
[im 88/176]
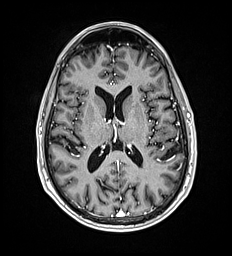
[im 110/176]
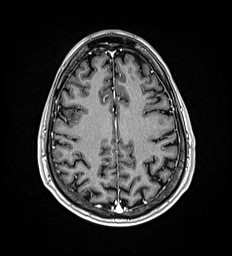
[im 132/176]
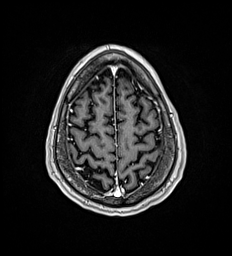
[im 154/176]
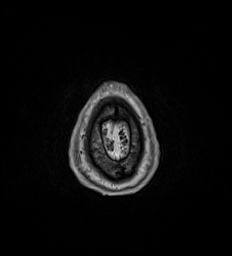
[im 176/176]
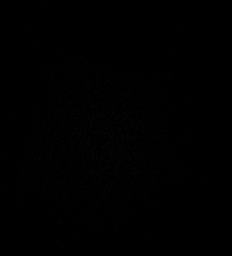

[Series 20: T1 post-contrast · coronal · 5.0mm · 0.57mm/px · 2 of 29 slices shown (2 of 2)]
[im 1/29]
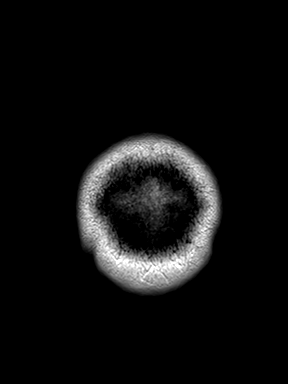
[im 29/29]
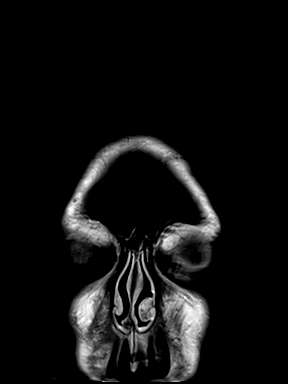

[36 of 48 positions shown; findings below may reference images not displayed]

FINDINGS: BRAIN: There is no acute infarct, acute hemorrhage or extra-axial
collection. The white matter signal is normal for the patient's age.
The cerebral and cerebellar volume are age-appropriate. There is no
hydrocephalus. The midline structures are normal. The hippocampi are
normal and symmetric in size and signal. The hypothalamus and
mamillary bodies are normal. There is no cortical ectopia or
dysplasia.

VASCULAR: The major intracranial arterial and venous sinus flow
voids are normal. Susceptibility-sensitive sequences show no chronic
microhemorrhage or superficial siderosis.

SKULL AND UPPER CERVICAL SPINE: Calvarial bone marrow signal is
normal. There is no skull base mass. The visualized upper cervical
spine and soft tissues are normal.

SINUSES/ORBITS: Right maxillary sinus mucosal thickening. Small
amount of left mastoid fluid. The orbits are normal.
IMPRESSION: Normal brain.

## 2019-04-08 MED ORDER — GADOBUTROL 1 MMOL/ML IV SOLN
6.0000 mL | Freq: Once | INTRAVENOUS | Status: AC | PRN
  Administered 2019-04-08: 6 mL via INTRAVENOUS

## 2019-04-11 ENCOUNTER — Emergency Department: Payer: Managed Care, Other (non HMO)

## 2019-04-11 ENCOUNTER — Encounter: Payer: Self-pay | Admitting: Emergency Medicine

## 2019-04-11 ENCOUNTER — Emergency Department
Admission: EM | Admit: 2019-04-11 | Discharge: 2019-04-11 | Discharge status: Against Medical Advice | Payer: Managed Care, Other (non HMO) | Attending: Emergency Medicine | Admitting: Emergency Medicine

## 2019-04-11 DIAGNOSIS — S299XXA Unspecified injury of thorax, initial encounter: Secondary | ICD-10-CM | POA: Diagnosis present

## 2019-04-11 DIAGNOSIS — Y9389 Activity, other specified: Secondary | ICD-10-CM | POA: Diagnosis not present

## 2019-04-11 DIAGNOSIS — Y999 Unspecified external cause status: Secondary | ICD-10-CM | POA: Insufficient documentation

## 2019-04-11 DIAGNOSIS — S20419A Abrasion of unspecified back wall of thorax, initial encounter: Secondary | ICD-10-CM | POA: Insufficient documentation

## 2019-04-11 DIAGNOSIS — R51 Headache: Secondary | ICD-10-CM | POA: Insufficient documentation

## 2019-04-11 DIAGNOSIS — M542 Cervicalgia: Secondary | ICD-10-CM | POA: Diagnosis not present

## 2019-04-11 DIAGNOSIS — W1789XA Other fall from one level to another, initial encounter: Secondary | ICD-10-CM | POA: Diagnosis not present

## 2019-04-11 DIAGNOSIS — Y929 Unspecified place or not applicable: Secondary | ICD-10-CM | POA: Insufficient documentation

## 2019-04-11 DIAGNOSIS — S22060A Wedge compression fracture of T7-T8 vertebra, initial encounter for closed fracture: Secondary | ICD-10-CM | POA: Insufficient documentation

## 2019-04-11 DIAGNOSIS — F17210 Nicotine dependence, cigarettes, uncomplicated: Secondary | ICD-10-CM | POA: Diagnosis not present

## 2019-04-11 DIAGNOSIS — S32010A Wedge compression fracture of first lumbar vertebra, initial encounter for closed fracture: Secondary | ICD-10-CM | POA: Insufficient documentation

## 2019-04-11 DIAGNOSIS — S30810A Abrasion of lower back and pelvis, initial encounter: Secondary | ICD-10-CM | POA: Insufficient documentation

## 2019-04-11 DIAGNOSIS — T07XXXA Unspecified multiple injuries, initial encounter: Secondary | ICD-10-CM

## 2019-04-11 IMAGING — CT CT HEAD WITHOUT CONTRAST
4 of 7 series · 17 of 47 positions shown, 19 images · non-contrast
Comparison: [DATE] brain MR and [DATE] head CT

CLINICAL DATA: 43-year-old male with acute headache and neck pain
following fall yesterday. Initial encounter.

EXAM:
CT HEAD WITHOUT CONTRAST
CT CERVICAL SPINE WITHOUT CONTRAST
TECHNIQUE: Multidetector CT imaging of the head and cervical spine was
performed following the standard protocol without intravenous
contrast. Multiplanar CT image reconstructions of the cervical spine
were also generated.

[Series 3: head wo · axial · 0.47mm/px · z∈[-168,-63]mm · 6 of 31 slices shown, 8 images]
[im 5/31  brain]
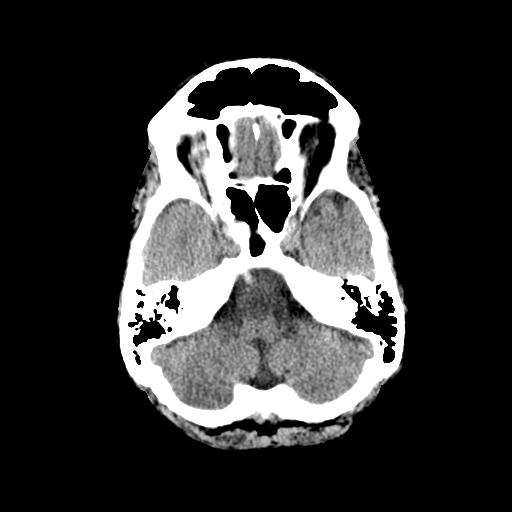
[im 5/31  bone]
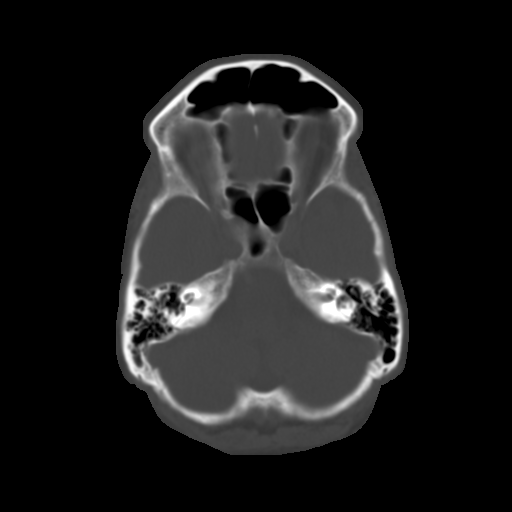
[im 9/31  brain]
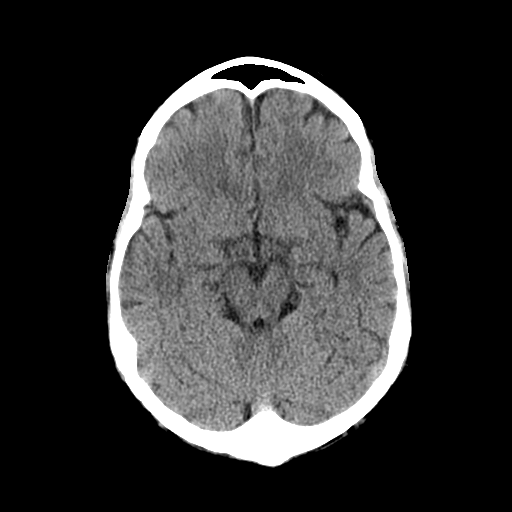
[im 13/31  brain]
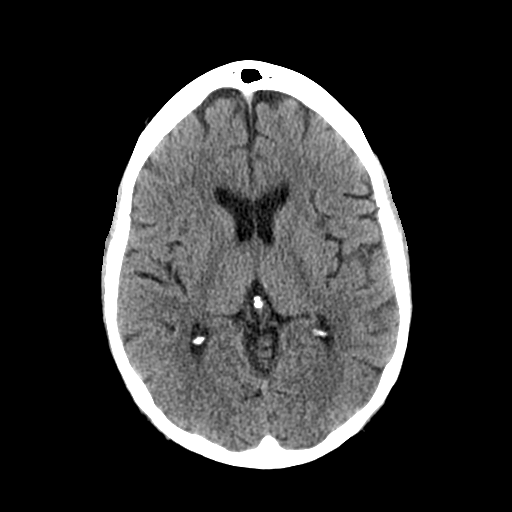
[im 18/31  brain]
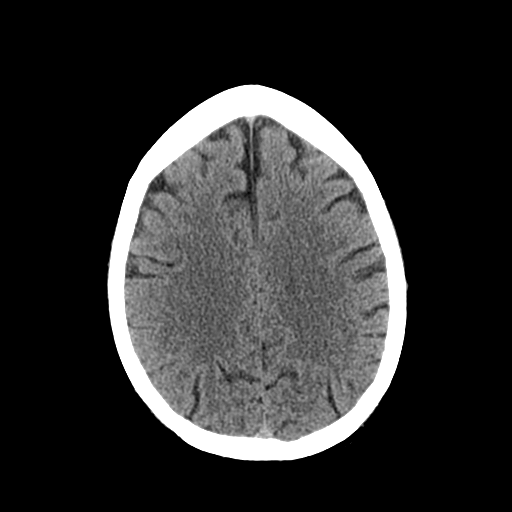
[im 22/31  brain]
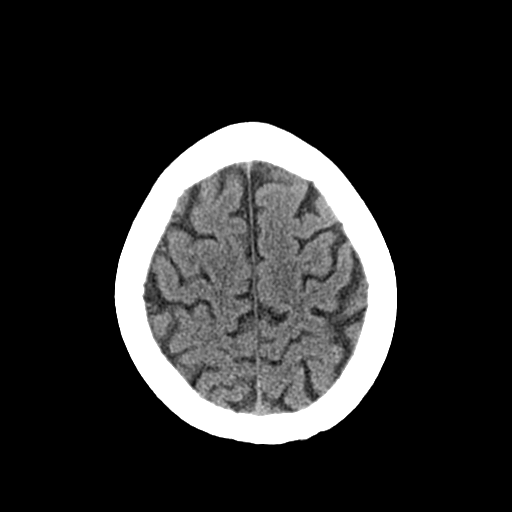
[im 22/31  bone]
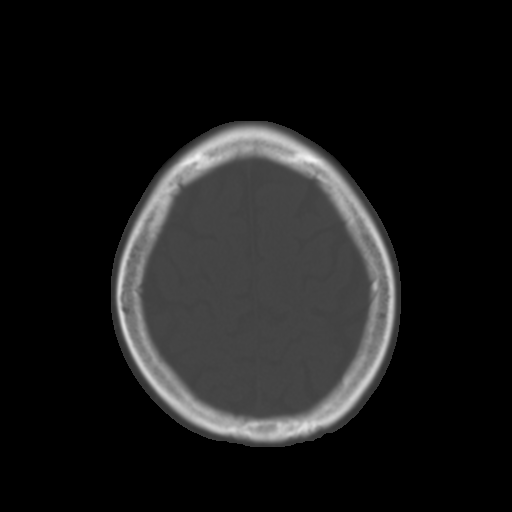
[im 26/31  brain]
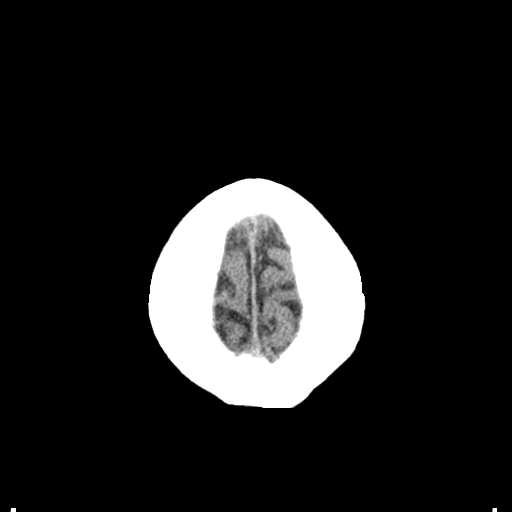

[Series 4: coronal soft tissue · coronal · 0.31mm/px · 3 of 70 slices shown]
[im 24/70  brain]
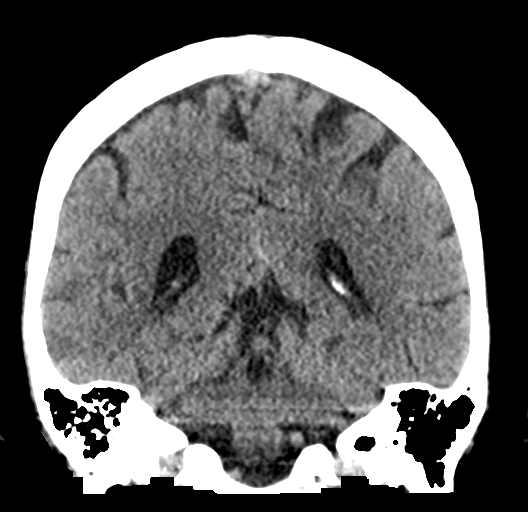
[im 35/70  brain]
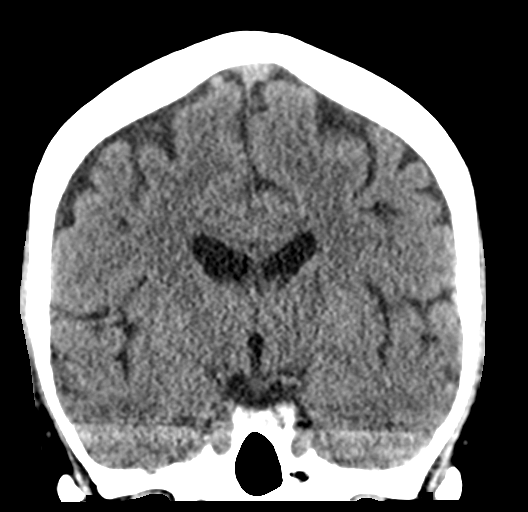
[im 47/70  brain]
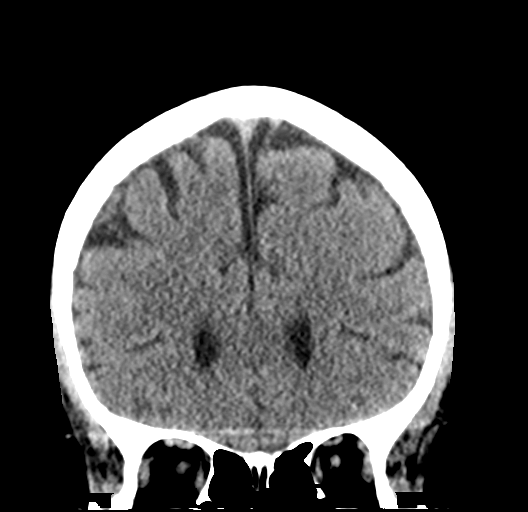

[Series 7: c spine soft · axial · 0.37mm/px · z∈[-323,-203]mm · 7 of 86 slices shown]
[im 9/86  brain]
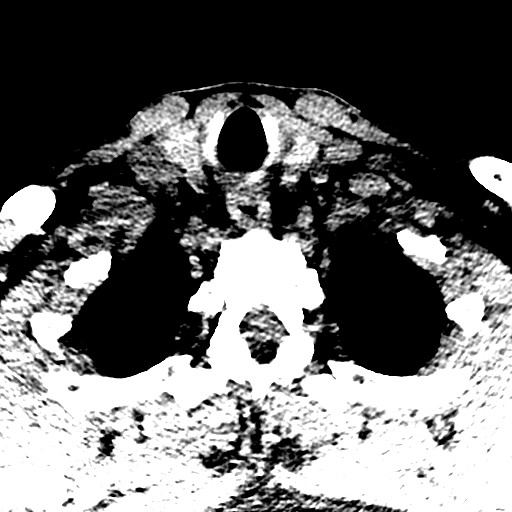
[im 17/86  brain]
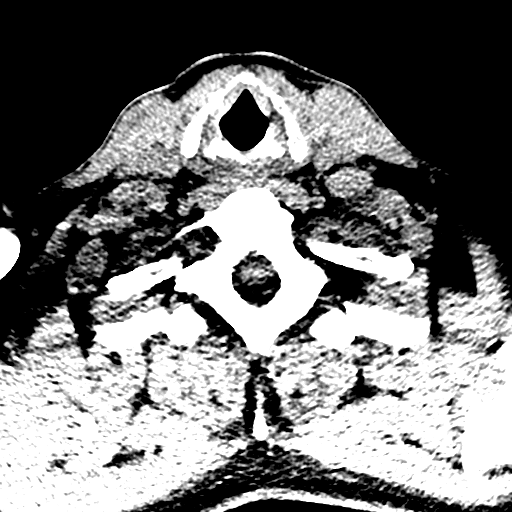
[im 29/86  brain]
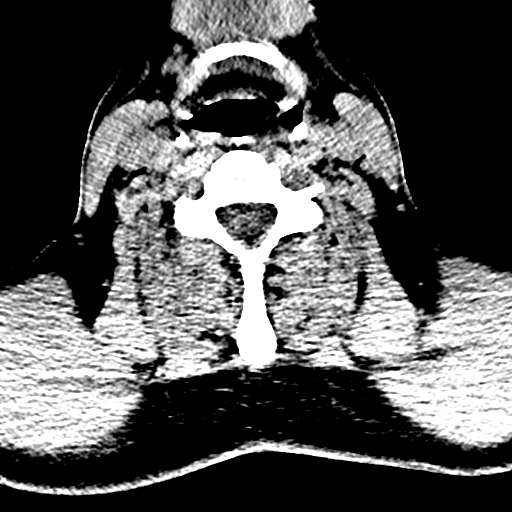
[im 37/86  brain]
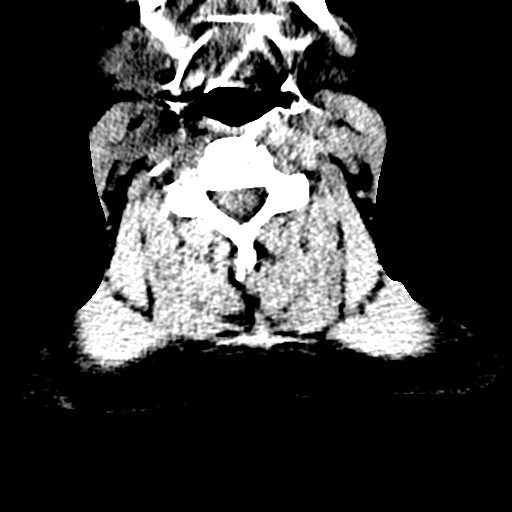
[im 49/86  brain]
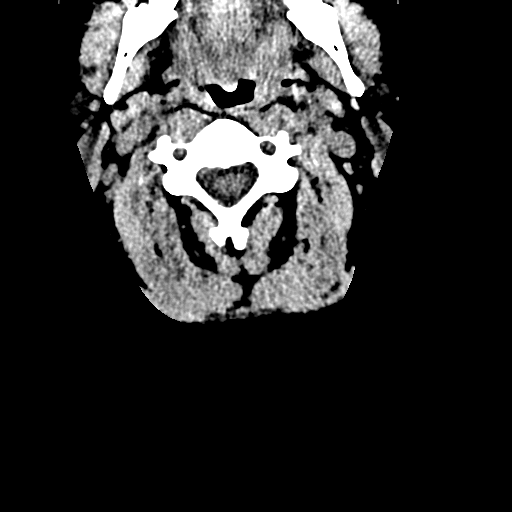
[im 57/86  brain]
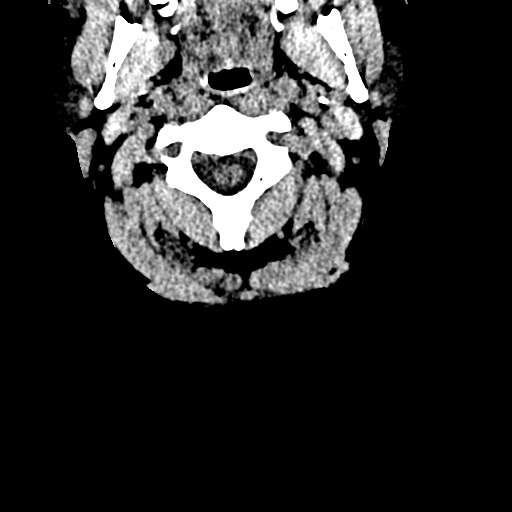
[im 69/86  brain]
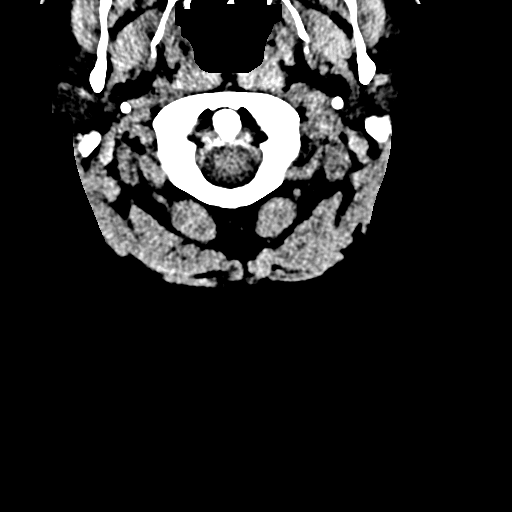

[Series 8: sagittal bone · sagittal · 0.30mm/px · 1 of 68 slices shown]
[im 34/68  brain]
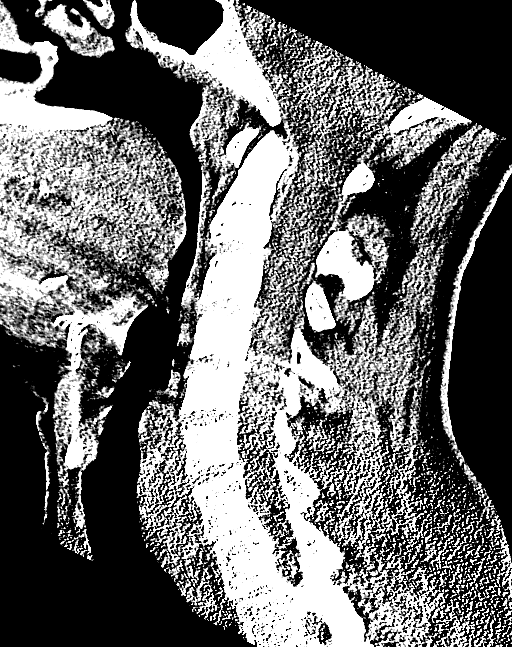

[17 of 47 positions shown; findings below may reference images not displayed]

FINDINGS: CT HEAD FINDINGS

Brain: No evidence of acute infarction, hemorrhage, hydrocephalus,
extra-axial collection or mass lesion/mass effect.

Vascular: No hyperdense vessel or unexpected calcification.

Skull: Normal. Negative for fracture or focal lesion.

Sinuses/Orbits: No acute abnormality. Small amount of fluid in the
RIGHT maxillary sinus is again noted.

Other: None.

CT CERVICAL SPINE FINDINGS

Alignment: Normal.

Skull base and vertebrae: No acute fracture. No primary bone lesion
or focal pathologic process.

Soft tissues and spinal canal: No prevertebral fluid or swelling. No
visible canal hematoma.

Disc levels:  Unremarkable

Upper chest: Negative.

Other: None
IMPRESSION: 1. No evidence of intracranial abnormality.
2. No static evidence of acute injury to the cervical spine.

## 2019-04-11 IMAGING — CR THORACIC SPINE 2 VIEWS
3 series · 3 of 3 positions shown · non-contrast
Comparison: None.

CLINICAL DATA: Low back pain.

EXAM:
THORACIC SPINE 2 VIEWS

[t-spine ap]
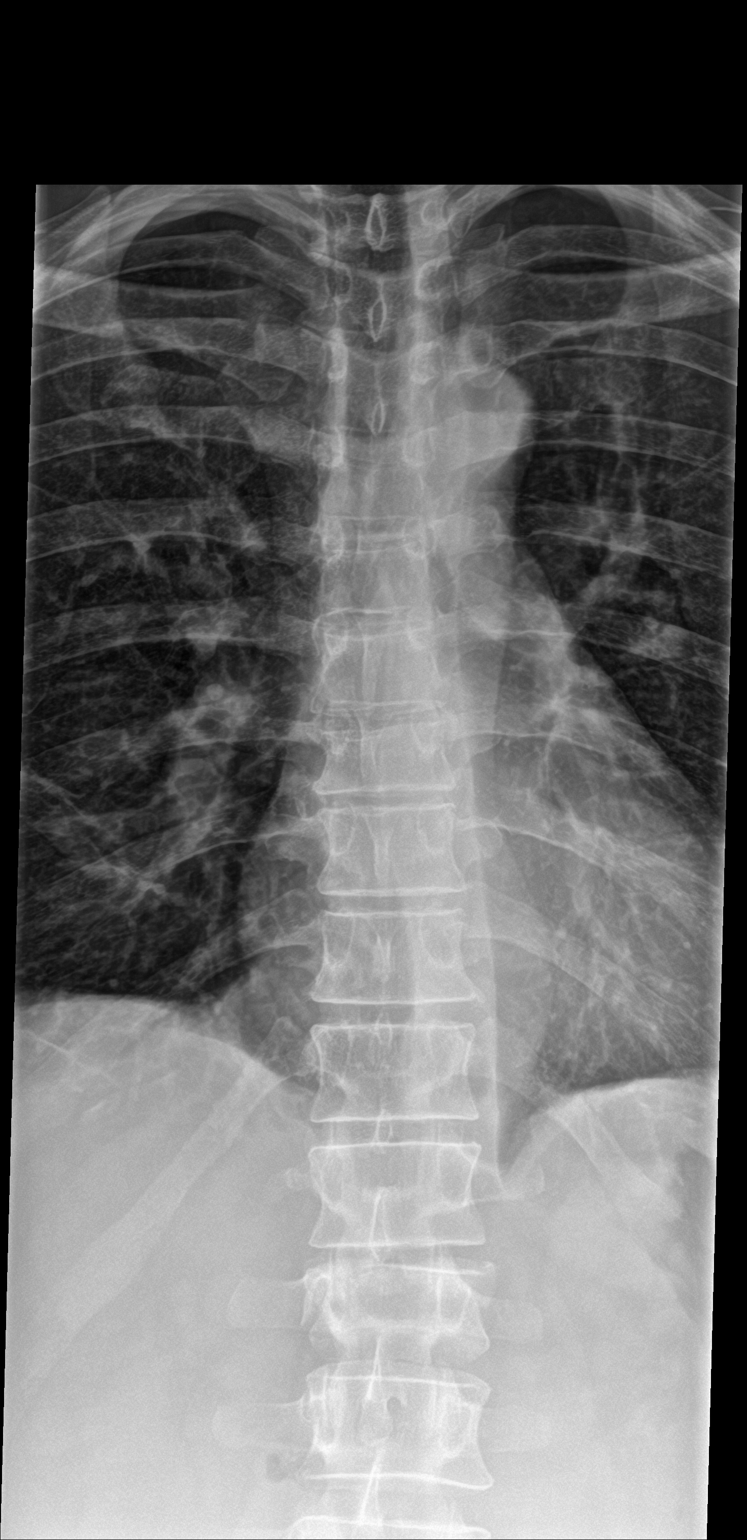

[t-spine lat]
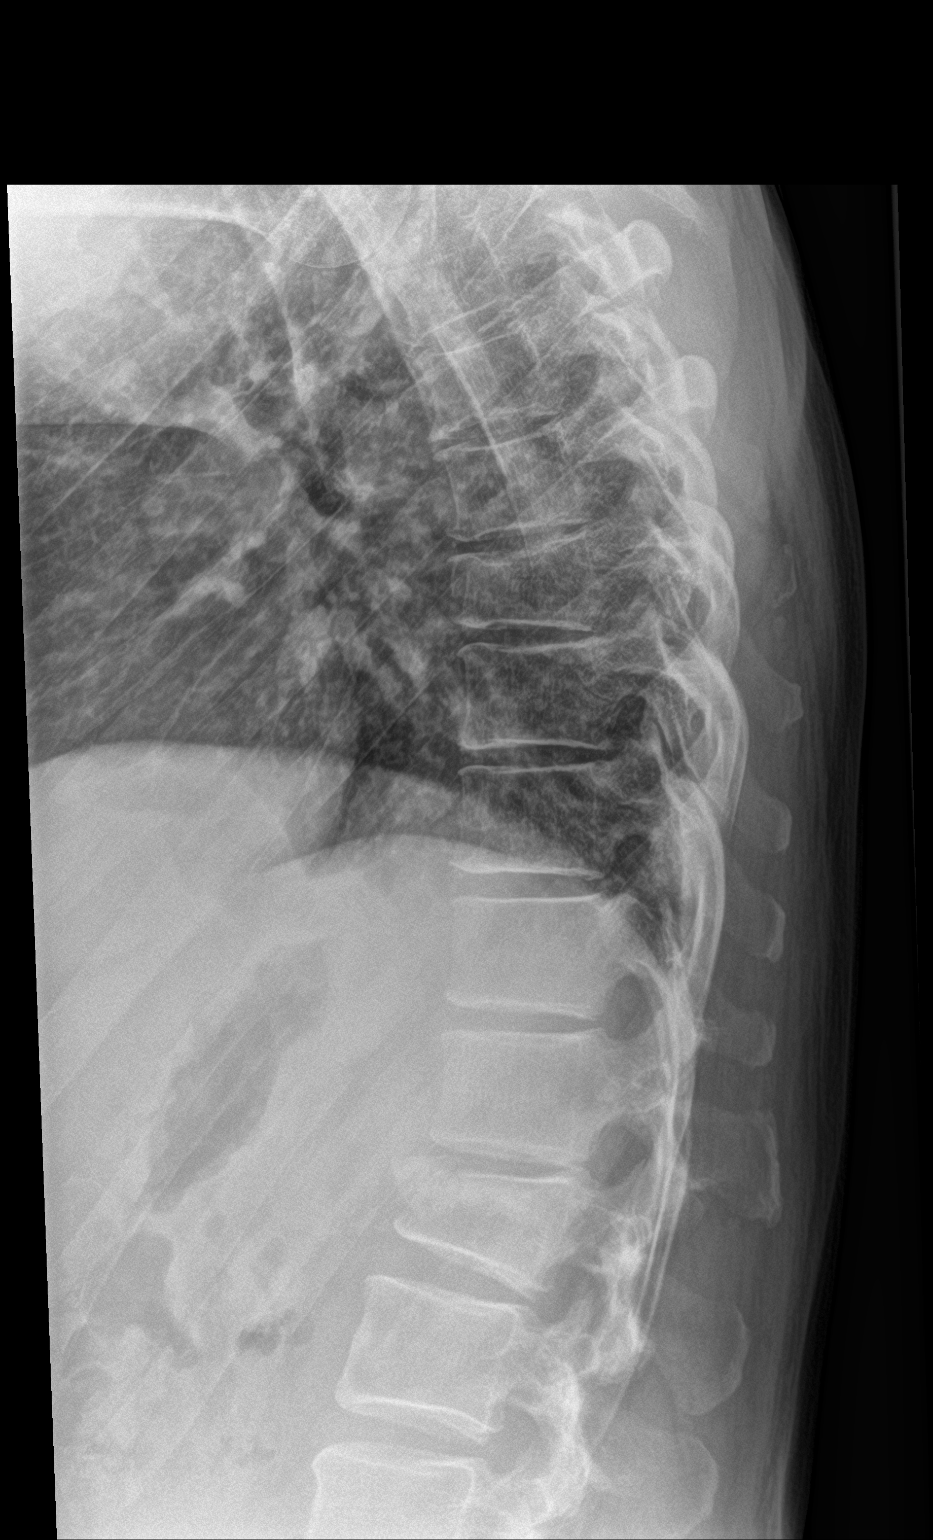

[t-spine swimmers]
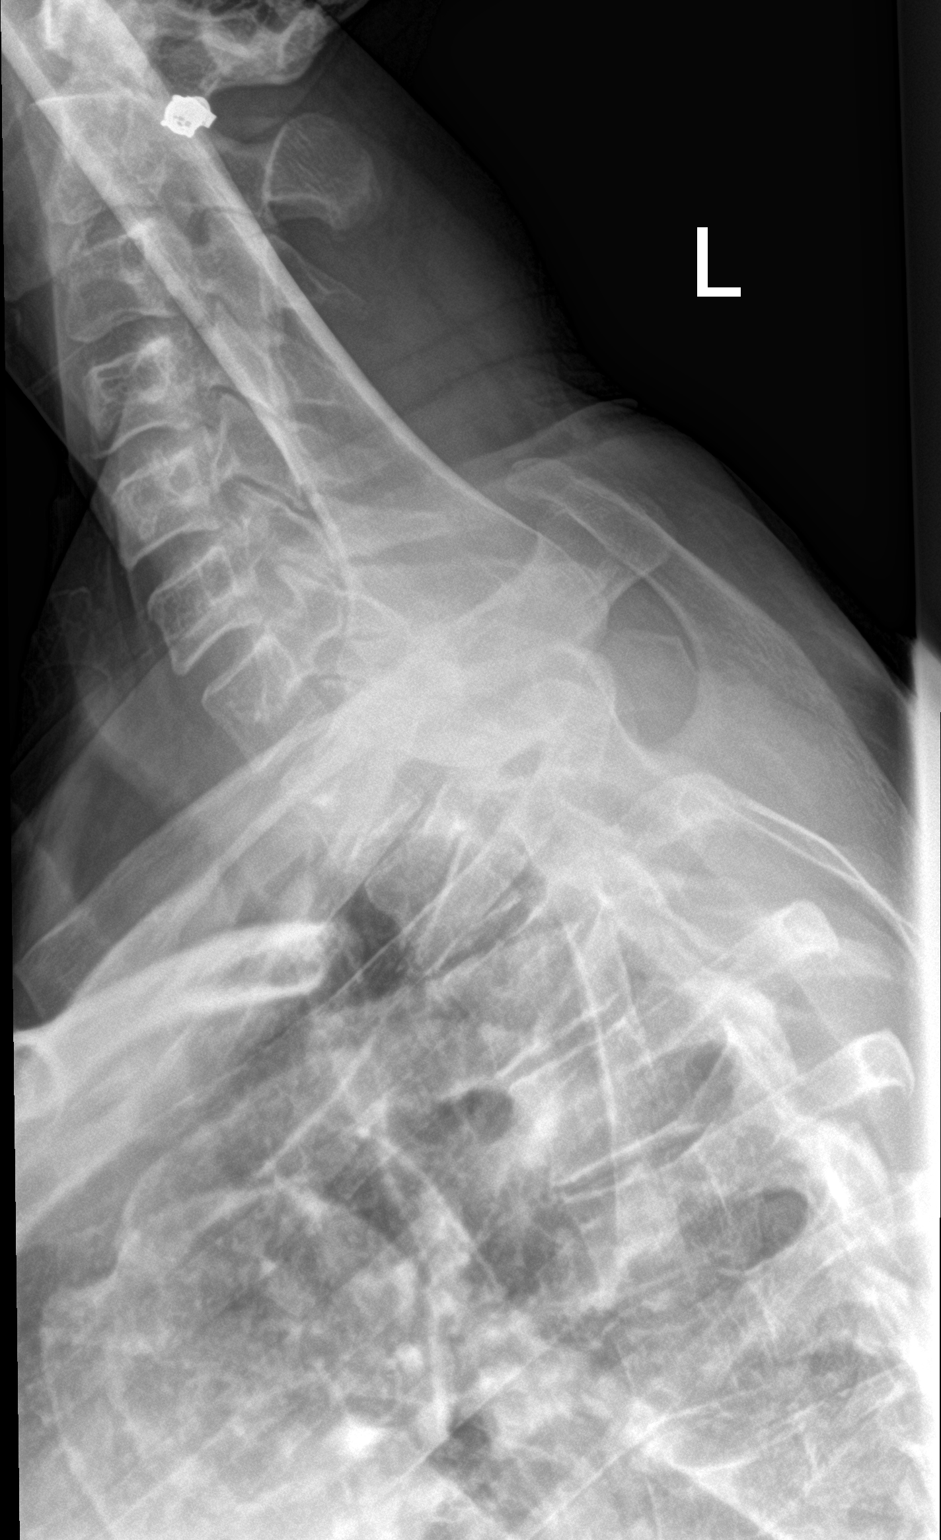

[3 of 3 positions shown; findings below may reference images not displayed]

FINDINGS: There is anterior wedging of T8 with approximately 20% loss of
anterior height consistent with a compression fracture. There is
anterior wedging of L1 consistent with another compression fracture
which will be described on the dedicated lumbar x-ray. There is no
obvious extension into the posterior elements at either level and no
inter pedicular widening is identified. No traumatic malalignment.
Limited views of the chest are normal.
IMPRESSION: 1. 20% loss of anterior height of T8 is consistent with a
compression fracture, likely acute.
2. L1 vertebral body compression fracture, likely acute, will be
described on the dedicated lumbar x-ray.
3. No traumatic malalignment identified.

## 2019-04-11 IMAGING — CT CT HEAD WITHOUT CONTRAST
1 series · 14 of 30 positions shown, 18 images · non-contrast
Comparison: [DATE] brain MR and [DATE] head CT

CLINICAL DATA: 43-year-old male with acute headache and neck pain
following fall yesterday. Initial encounter.

EXAM:
CT HEAD WITHOUT CONTRAST
CT CERVICAL SPINE WITHOUT CONTRAST
TECHNIQUE: Multidetector CT imaging of the head and cervical spine was
performed following the standard protocol without intravenous
contrast. Multiplanar CT image reconstructions of the cervical spine
were also generated.

[Series 13: orthogonal bone 2 · axial · 0.27mm/px · z∈[-368,-211]mm · 14 of 99 slices shown, 18 images]
[im 7/99  brain]
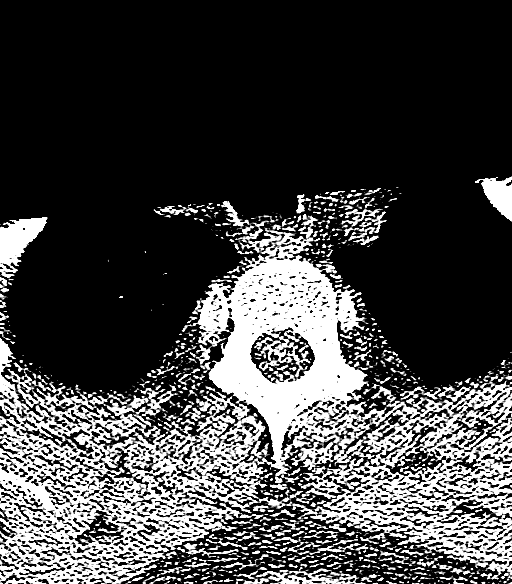
[im 7/99  bone]
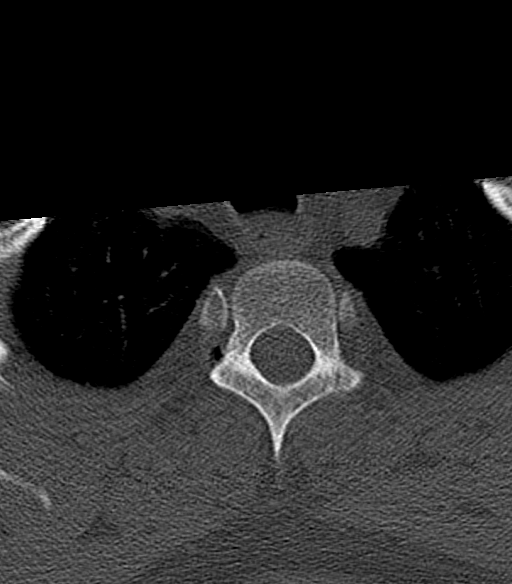
[im 14/99  brain]
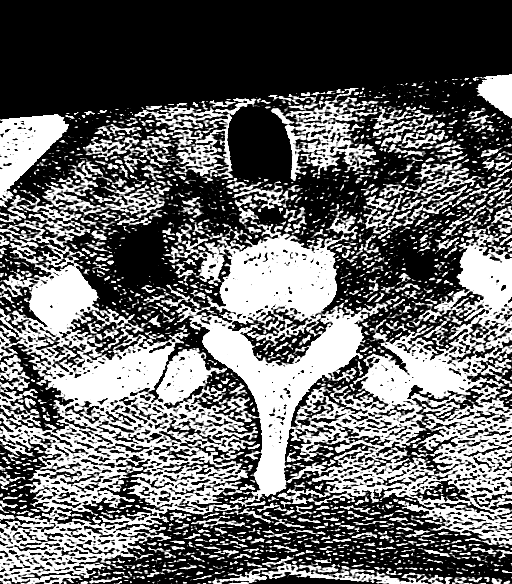
[im 21/99  brain]
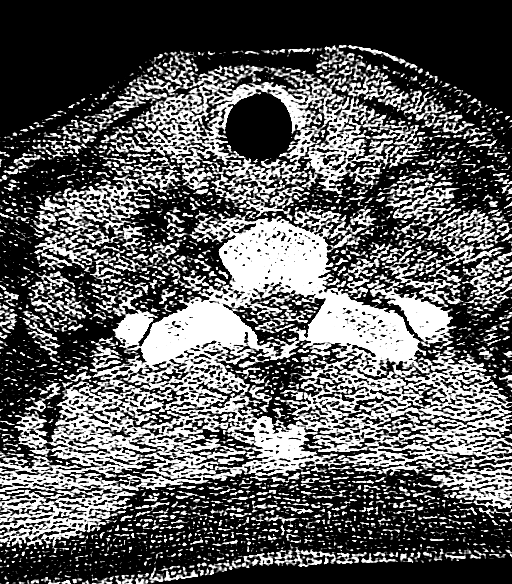
[im 28/99  brain]
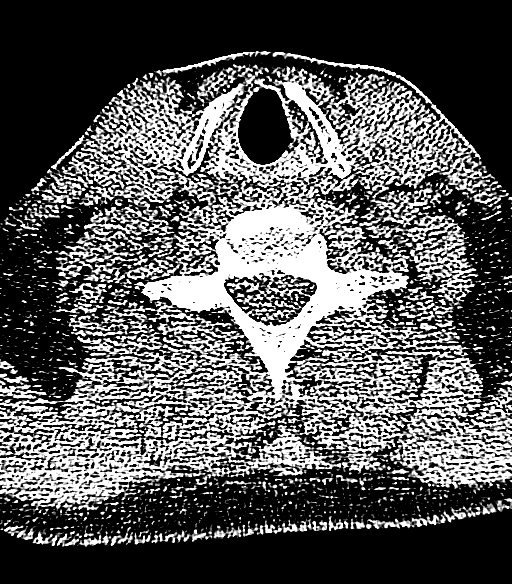
[im 34/99  brain]
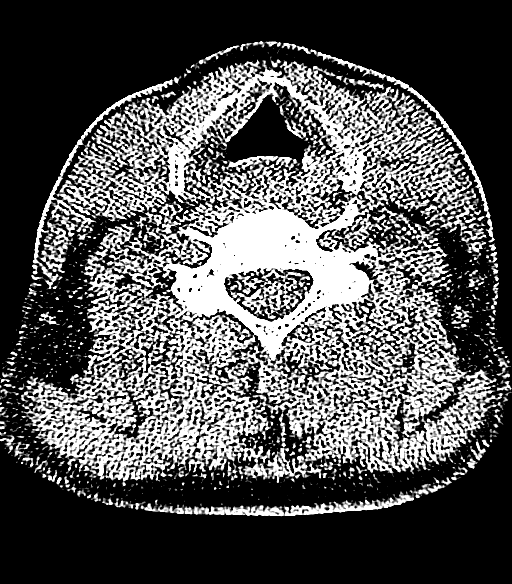
[im 34/99  bone]
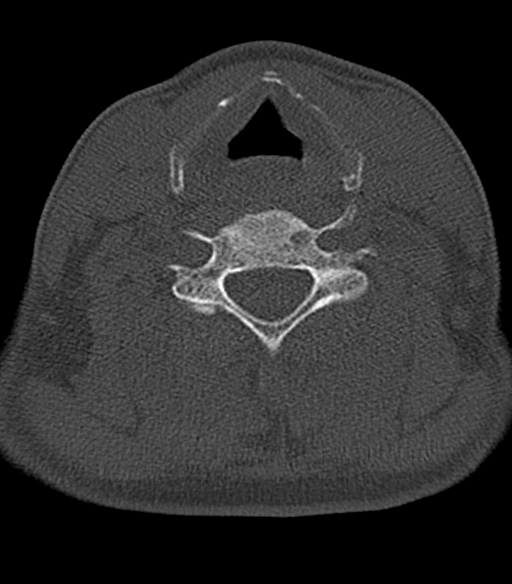
[im 41/99  brain]
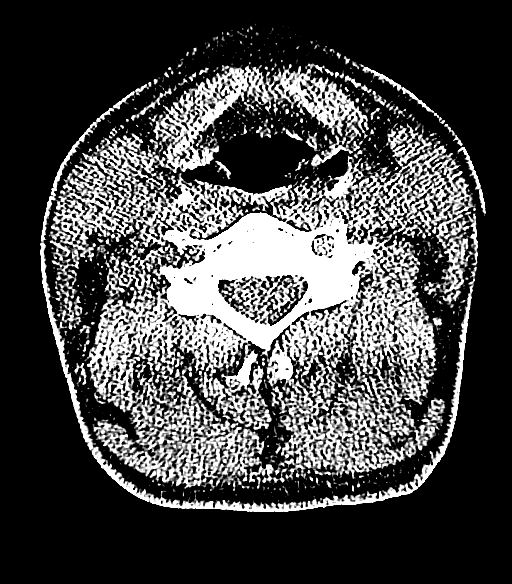
[im 48/99  brain]
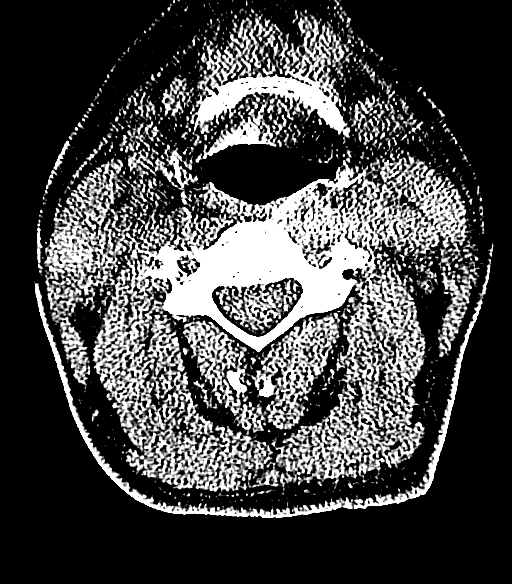
[im 55/99  brain]
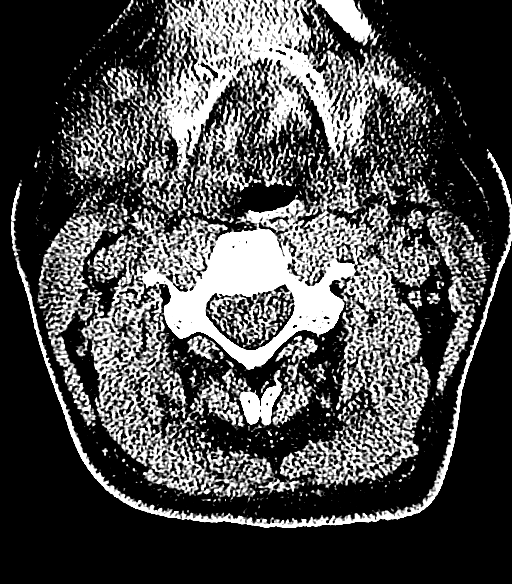
[im 61/99  brain]
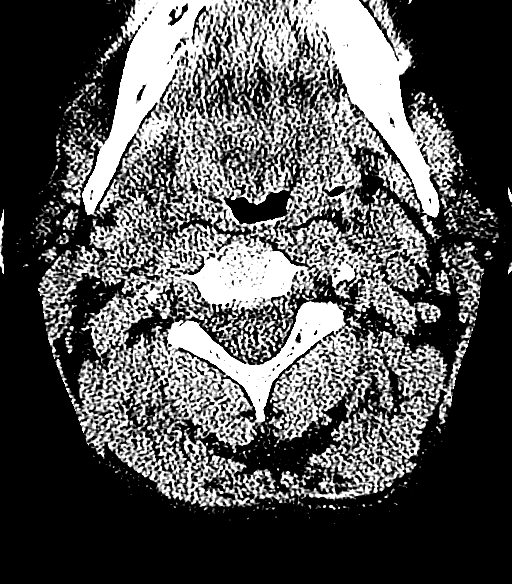
[im 61/99  bone]
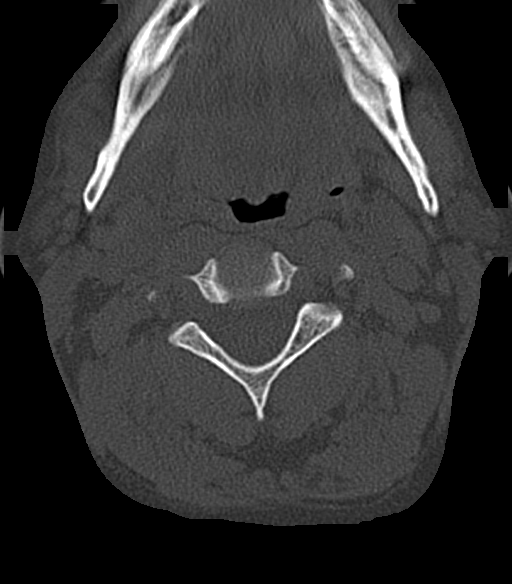
[im 68/99  brain]
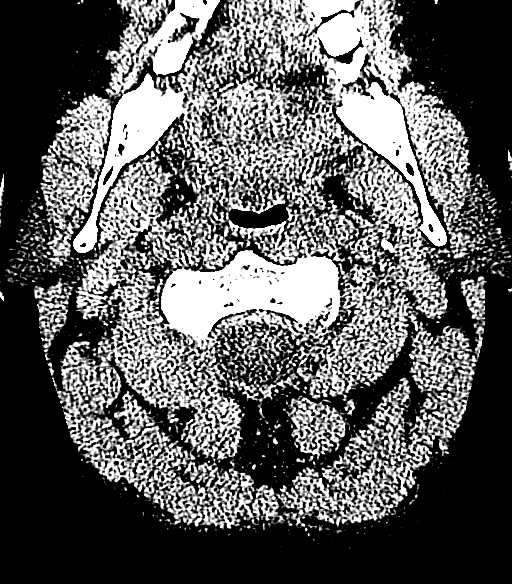
[im 75/99  brain]
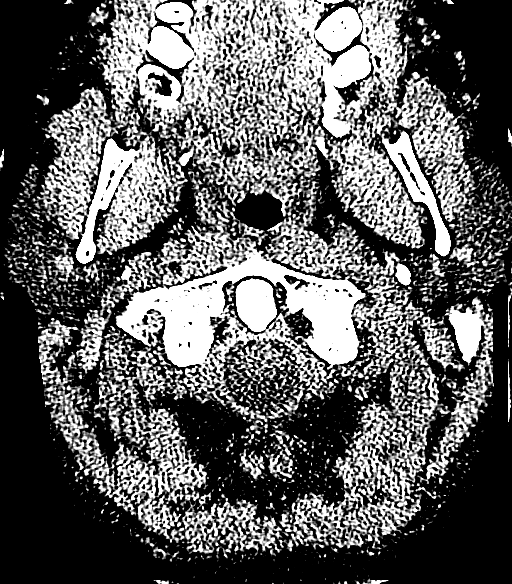
[im 82/99  brain]
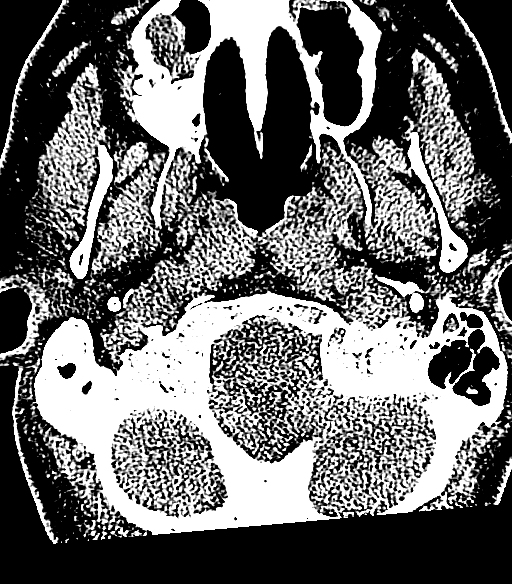
[im 88/99  brain]
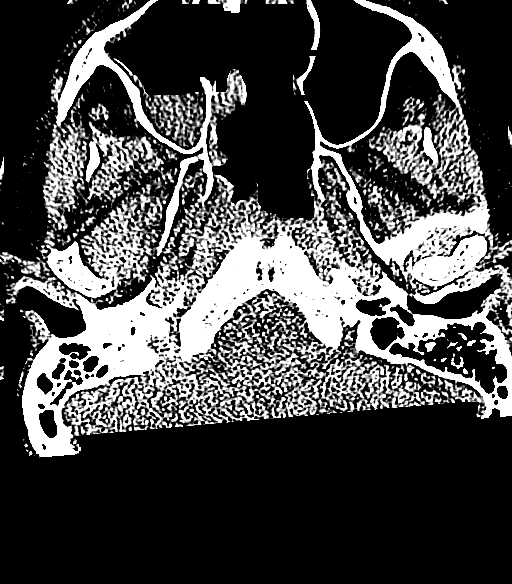
[im 88/99  bone]
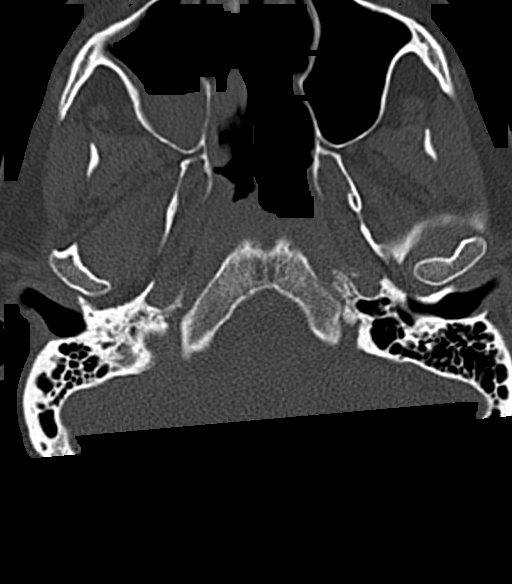
[im 95/99  brain]
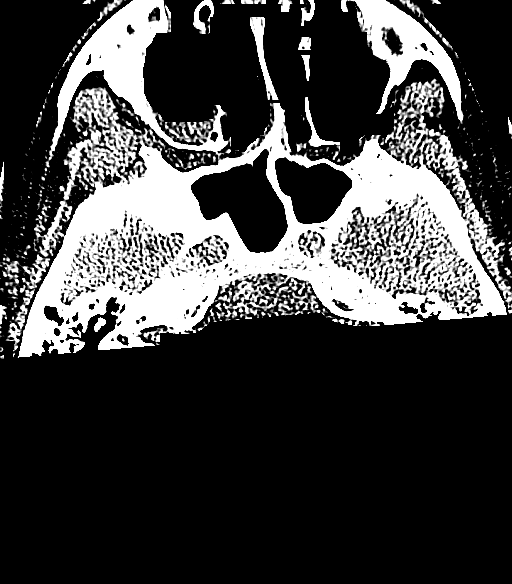

[14 of 30 positions shown; findings below may reference images not displayed]

FINDINGS: CT HEAD FINDINGS

Brain: No evidence of acute infarction, hemorrhage, hydrocephalus,
extra-axial collection or mass lesion/mass effect.

Vascular: No hyperdense vessel or unexpected calcification.

Skull: Normal. Negative for fracture or focal lesion.

Sinuses/Orbits: No acute abnormality. Small amount of fluid in the
RIGHT maxillary sinus is again noted.

Other: None.

CT CERVICAL SPINE FINDINGS

Alignment: Normal.

Skull base and vertebrae: No acute fracture. No primary bone lesion
or focal pathologic process.

Soft tissues and spinal canal: No prevertebral fluid or swelling. No
visible canal hematoma.

Disc levels:  Unremarkable

Upper chest: Negative.

Other: None
IMPRESSION: 1. No evidence of intracranial abnormality.
2. No static evidence of acute injury to the cervical spine.

## 2019-04-11 IMAGING — CT CT CERVICAL SPINE WITHOUT CONTRAST
3 of 8 series · 15 of 33 positions shown, 17 images · non-contrast
Comparison: [DATE] brain MR and [DATE] head CT

CLINICAL DATA: 43-year-old male with acute headache and neck pain
following fall yesterday. Initial encounter.

EXAM:
CT HEAD WITHOUT CONTRAST
CT CERVICAL SPINE WITHOUT CONTRAST
TECHNIQUE: Multidetector CT imaging of the head and cervical spine was
performed following the standard protocol without intravenous
contrast. Multiplanar CT image reconstructions of the cervical spine
were also generated.

[Series 4: coronal soft tissue · coronal · 0.31mm/px · 3 of 70 slices shown]
[im 18/70  bone]
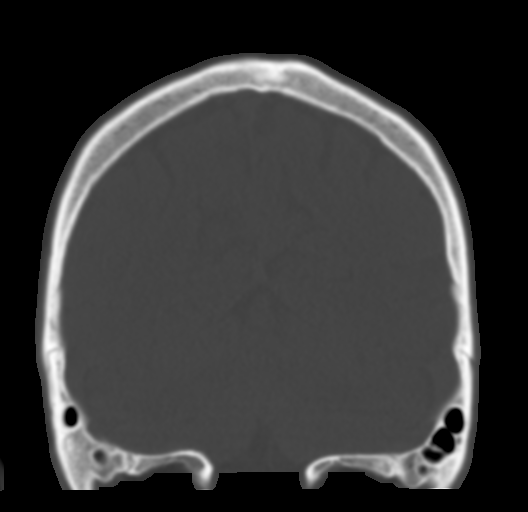
[im 35/70  bone]
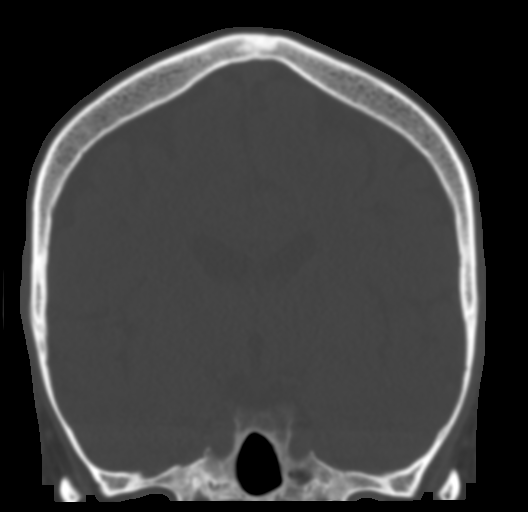
[im 52/70  bone]
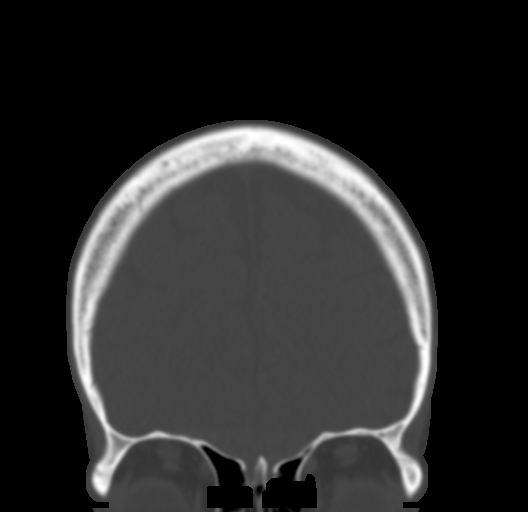

[Series 8: sagittal bone · sagittal · 0.30mm/px · 5 of 68 slices shown]
[im 12/68  bone]
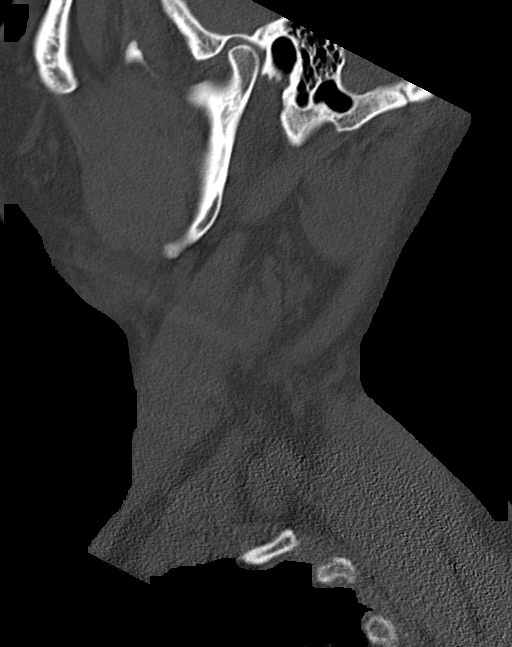
[im 23/68  bone]
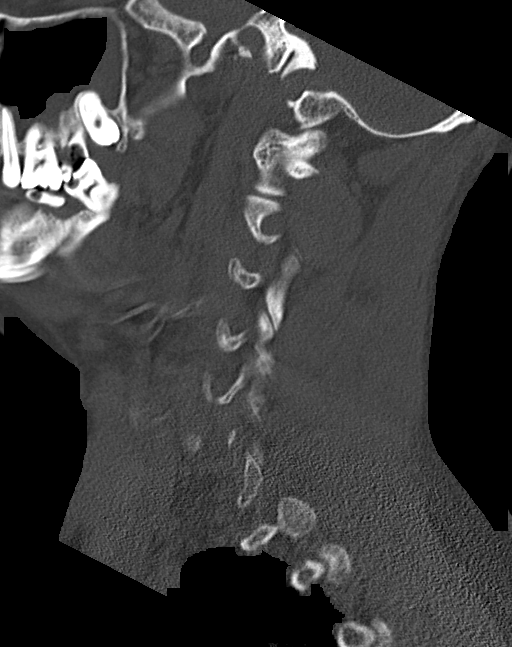
[im 34/68  bone]
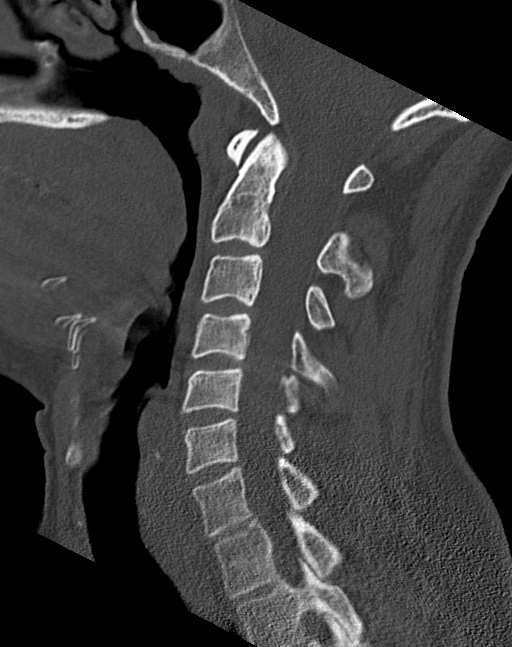
[im 45/68  bone]
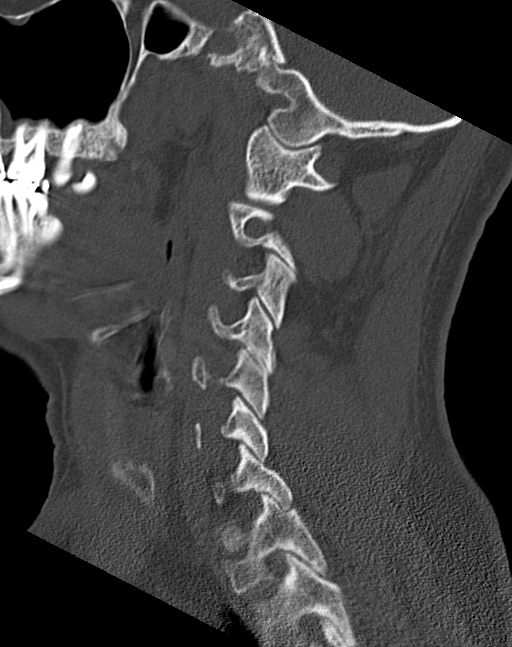
[im 56/68  bone]
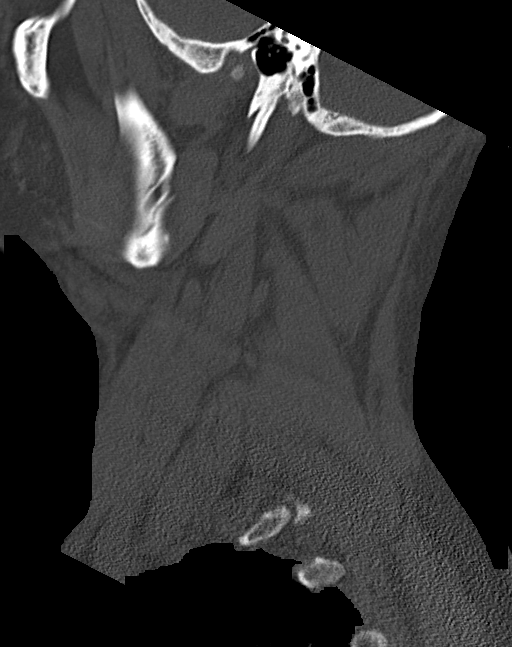

[Series 12: thins st · axial · 0.37mm/px · z∈[-319,-190]mm · 7 of 343 slices shown, 9 images]
[im 43/343  soft-tissue]
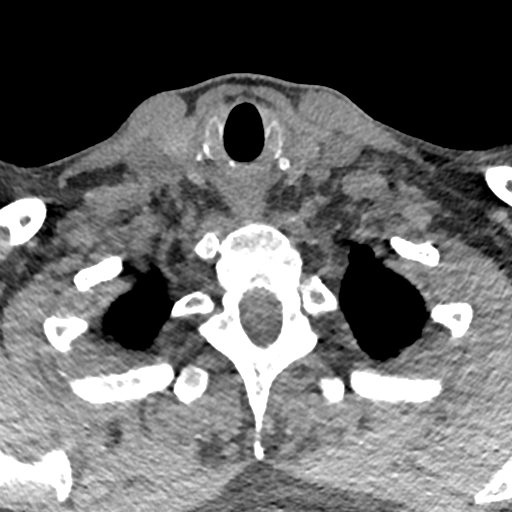
[im 43/343  bone]
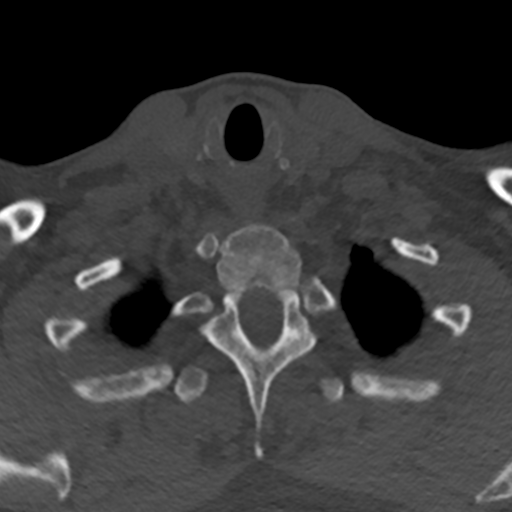
[im 86/343  bone]
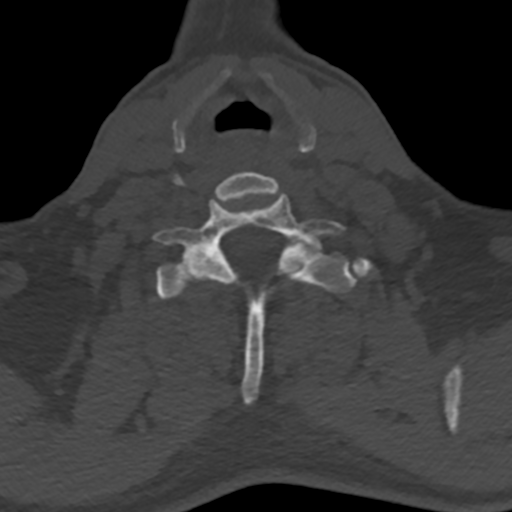
[im 129/343  bone]
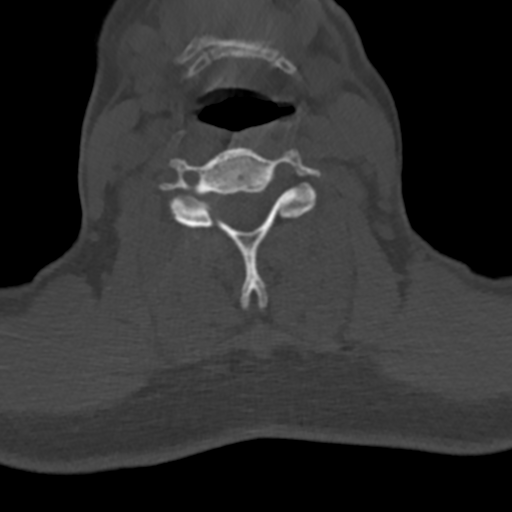
[im 172/343  bone]
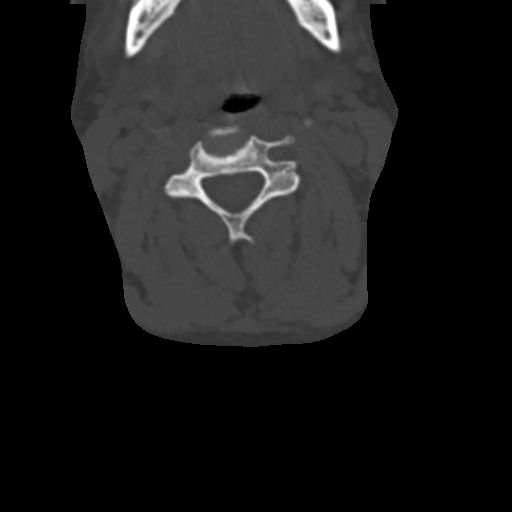
[im 214/343  soft-tissue]
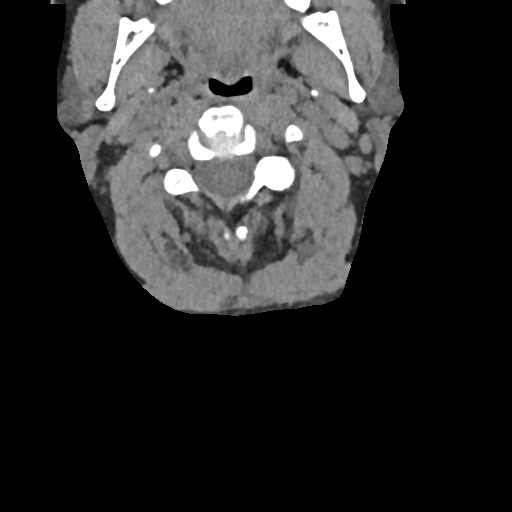
[im 214/343  bone]
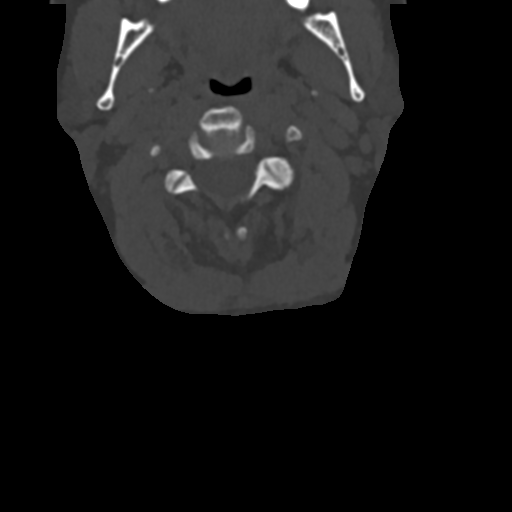
[im 257/343  bone]
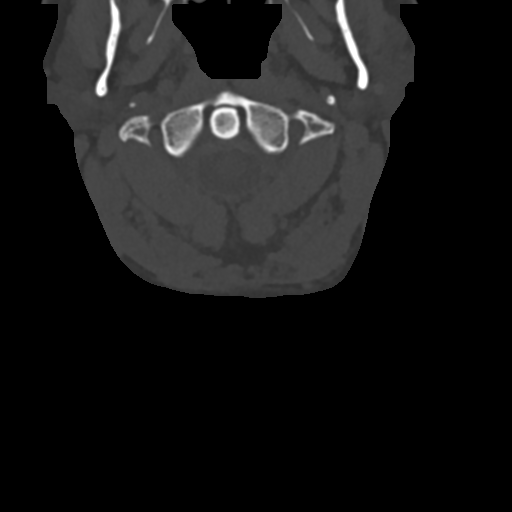
[im 300/343  bone]
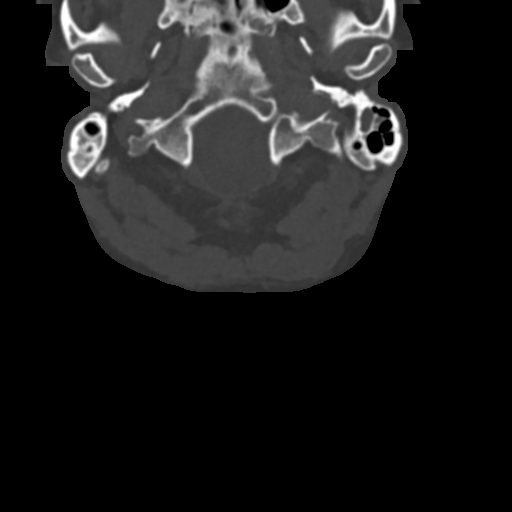

[15 of 33 positions shown; findings below may reference images not displayed]

FINDINGS: CT HEAD FINDINGS

Brain: No evidence of acute infarction, hemorrhage, hydrocephalus,
extra-axial collection or mass lesion/mass effect.

Vascular: No hyperdense vessel or unexpected calcification.

Skull: Normal. Negative for fracture or focal lesion.

Sinuses/Orbits: No acute abnormality. Small amount of fluid in the
RIGHT maxillary sinus is again noted.

Other: None.

CT CERVICAL SPINE FINDINGS

Alignment: Normal.

Skull base and vertebrae: No acute fracture. No primary bone lesion
or focal pathologic process.

Soft tissues and spinal canal: No prevertebral fluid or swelling. No
visible canal hematoma.

Disc levels:  Unremarkable

Upper chest: Negative.

Other: None
IMPRESSION: 1. No evidence of intracranial abnormality.
2. No static evidence of acute injury to the cervical spine.

## 2019-04-11 IMAGING — CR LUMBAR SPINE - 2-3 VIEW
3 series · 3 of 3 positions shown · non-contrast
Comparison: None.

CLINICAL DATA: Fall 15-20 feet yesterday while intoxicated.

EXAM:
LUMBAR SPINE - 2-3 VIEW

[l-spine ap]
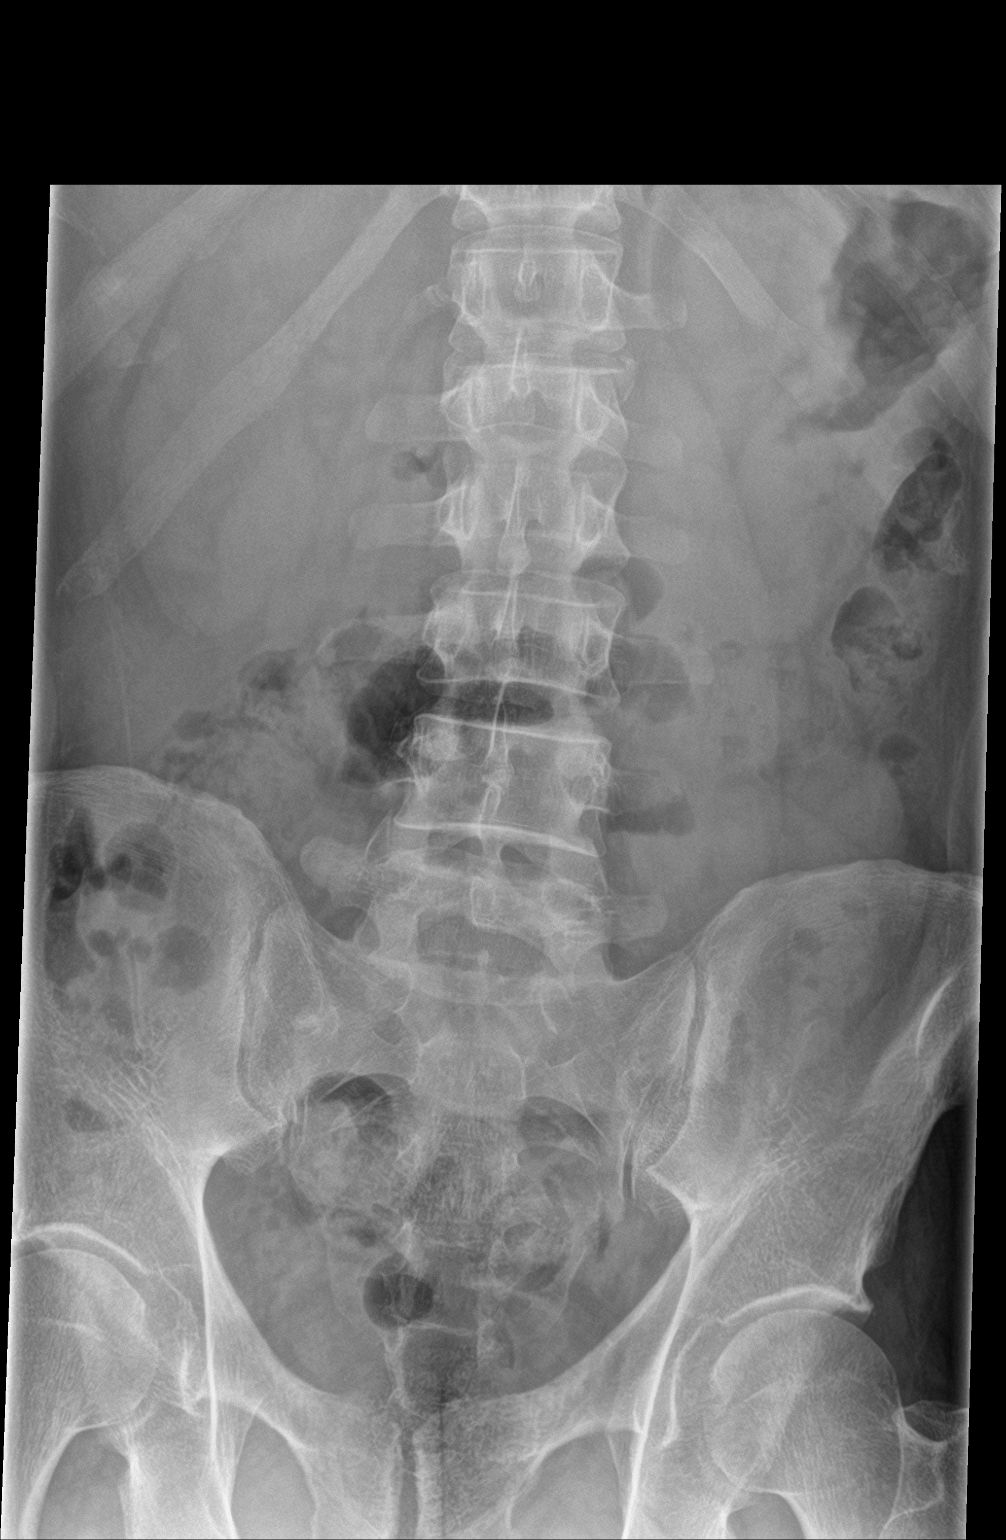

[l-spine lat]
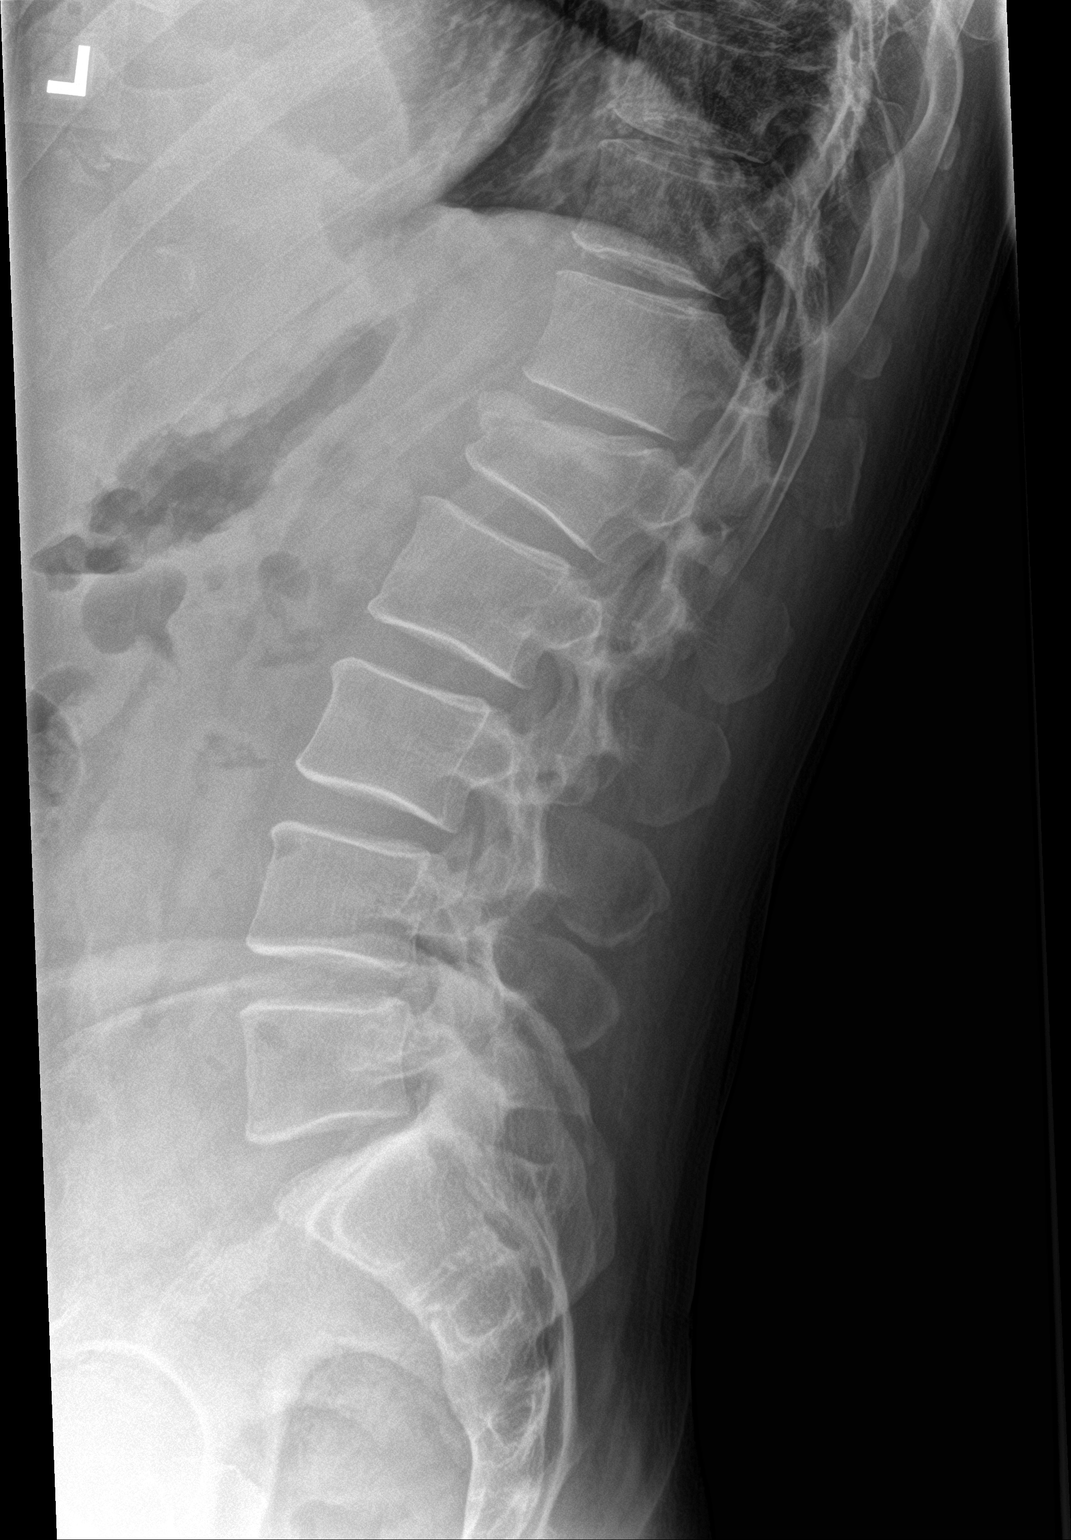

[l-spine spot]
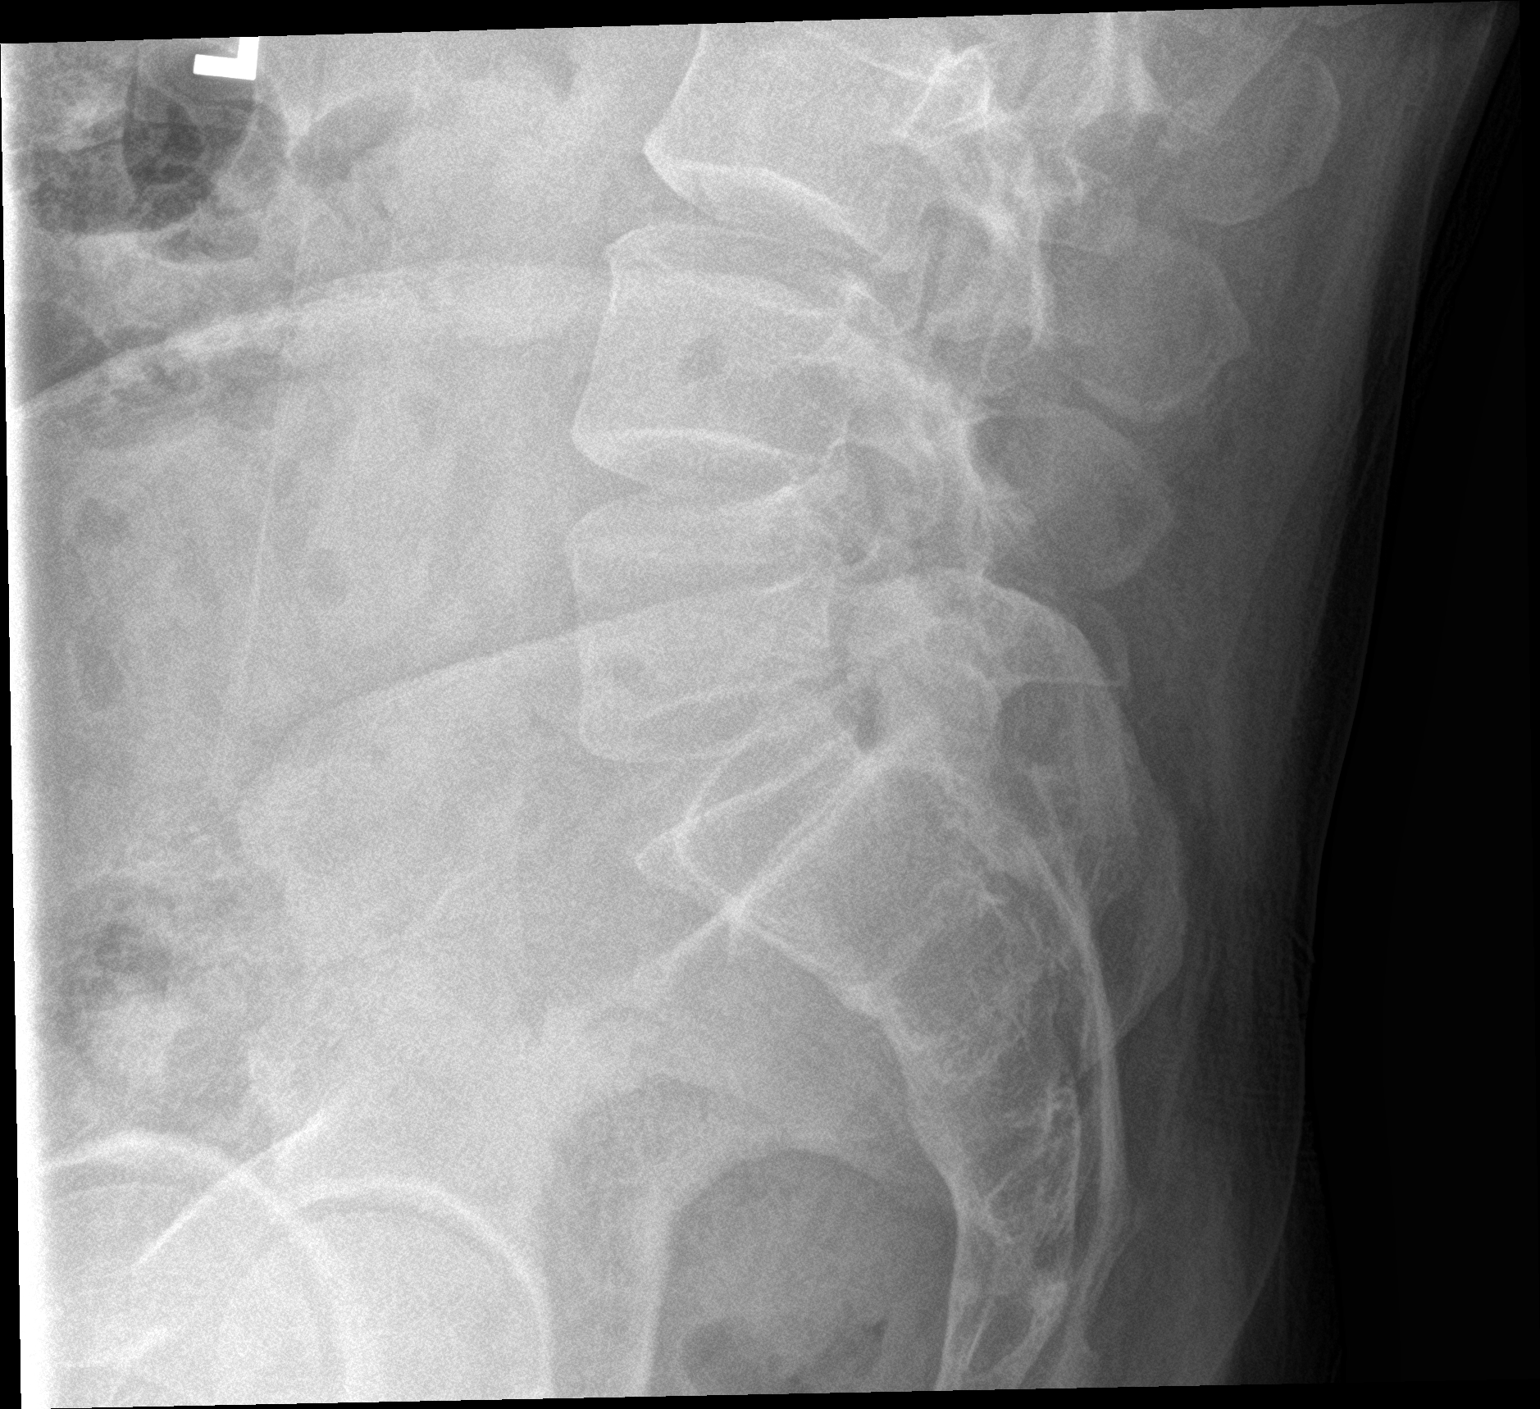

[3 of 3 positions shown; findings below may reference images not displayed]

FINDINGS: Moderate compression fracture of the L1 vertebral body with
approximately 50% loss of height, with minimal 2 mm bony
retropulsion into the anterior lumbar canal. No additional
fractures. No spondylolisthesis. No significant degenerative disc
disease. No suspicious focal osseous lesions.
IMPRESSION: Moderate L1 vertebral body compression fracture with minimal 2 mm
bony retropulsion into the anterior lumbar canal, presumably acute.
No spondylolisthesis.

## 2019-04-11 MED ORDER — HYDROCODONE-ACETAMINOPHEN 5-325 MG PO TABS
1.0000 | ORAL_TABLET | Freq: Once | ORAL | Status: AC
  Administered 2019-04-11: 1 via ORAL

## 2019-04-11 MED ORDER — HYDROCODONE-ACETAMINOPHEN 5-325 MG PO TABS: 1.0000 | ORAL_TABLET | Freq: Four times a day (QID) | ORAL | 0 refills | Status: AC | PRN

## 2019-04-11 MED ORDER — HYDROCODONE-ACETAMINOPHEN 7.5-325 MG PO TABS: 1.0000 | ORAL_TABLET | Freq: Once | ORAL | Status: DC

## 2019-04-11 NOTE — ED Triage Notes (Signed)
Pt to ED by EMS with c/o of lower back pain after falling backwards off of a fence. Pt states he fell approx 15-20 feet directly on back. Pt states he has been drinking and taking ibuprofen to "numb" the pain.

## 2019-04-11 NOTE — ED Provider Notes (Signed)
[facility] Emergency Department Provider Note  ____________________________________________   First MD Initiated Contact with Patient 04/11/19 1154     (approximate)  I have reviewed the triage vital signs and the nursing notes.   HISTORY  Chief Complaint Back Pain   HPI Steven Smith is a 43 y.o. male presents to the ED via EMS with complaint of "pain all over my back".  Patient states that yesterday he climbed a fence and then fell approximately 15 to 20 feet directly on his back.  He is unsure of hitting his head as he was intoxicated at the time he was doing this.  He has been taking ibuprofen to "numb the pain".  Patient denies any paresthesias, nausea, vomiting, visual changes.  Patient is up-to-date on immunizations.  He rates his pain as a 5 out of 10.     History reviewed. No pertinent past medical history.  There are no active problems to display for this patient.   History reviewed. No pertinent surgical history.  Prior to Admission medications   Medication Sig Start Date End Date Taking? Authorizing Provider  HYDROcodone-acetaminophen (NORCO/VICODIN) 5-325 MG tablet Take 1 tablet by mouth every 6 (six) hours as needed for moderate pain. 04/11/19   Tommi Rumps, PA-C    Allergies Patient has no known allergies.  History reviewed. No pertinent family history.  Social History Social History   Tobacco Use   Smoking status: Current Every Day Smoker    Types: Cigars   Smokeless tobacco: Current User  Substance Use Topics   Alcohol use: Yes    Alcohol/week: 7.0 standard drinks    Types: 7 Shots of liquor per week   Drug use: Not on file    Review of Systems Constitutional: No fever/chills Eyes: No visual changes. ENT: No trauma. Cardiovascular: Denies chest pain. Respiratory: Denies shortness of breath. Gastrointestinal: No abdominal pain.  No nausea, no vomiting.  No diarrhea.  No constipation. Genitourinary: Negative  for dysuria.  No hematuria. Musculoskeletal: Positive for upper and low back pain. Skin: Positive for superficial abrasions.   Neurological: Negative for headaches, focal weakness or numbness.  ____________________________________________   PHYSICAL EXAM:  VITAL SIGNS: ED Triage Vitals  Enc Vitals Group     BP 04/11/19 1140 129/84     Pulse Rate 04/11/19 1140 84     Resp 04/11/19 1140 16     Temp 04/11/19 1140 98.1 F (36.7 C)     Temp Source 04/11/19 1140 Oral     SpO2 04/11/19 1140 94 %     Weight 04/11/19 1141 155 lb (70.3 kg)     Height 04/11/19 1141 5\' 9"  (1.753 m)     Head Circumference --      Peak Flow --      Pain Score 04/11/19 1140 5     Pain Loc --      Pain Edu? --      Excl. in [place]? --     Constitutional: Alert and oriented. Well appearing and in no acute distress.  Patient appears to be sleeping on initial and second visit.  Patient using profane language at provider and nursing staff. Eyes: Conjunctivae are normal. PERRL. EOMI. Head: Atraumatic. Nose: No trauma. Mouth/Throat: No obvious dental injury. Neck: No stridor.  Nontender cervical spine. Cardiovascular: Normal rate, regular rhythm. Grossly normal heart sounds.  Good peripheral circulation. Respiratory: Normal respiratory effort.  No retractions. Lungs CTAB.  Nontender ribs to palpation bilaterally. Gastrointestinal: Soft and nontender. No  distention.Marland Kitchen. No CVA tenderness.  Bowel sounds are normoactive x4 quadrants. Musculoskeletal: Patient is able move upper and lower extremities without any difficulty.  On examination of the back there is diffuse tenderness on palpation of the thoracic and lumbar vertebral bodies.  No soft tissue edema or discoloration is noted.  Patient is ambulatory without assistance with a normal gait. Neurologic:  Normal speech and language. No gross focal neurologic deficits are appreciated. No gait instability. Skin:  Skin is warm, dry.  Diffuse superficial abrasions noted mostly  to the hands.  No active bleeding and no foreign body noted. Psychiatric: Mood and affect are normal. Speech and behavior are normal.  ____________________________________________   LABS (all labs ordered are listed, but only abnormal results are displayed)  Labs Reviewed - No data to display  RADIOLOGY   Official radiology report(s): Dg Thoracic Spine 2 View  Result Date: 04/11/2019 CLINICAL DATA:  Low back pain. EXAM: THORACIC SPINE 2 VIEWS COMPARISON:  None. FINDINGS: There is anterior wedging of T8 with approximately 20% loss of anterior height consistent with a compression fracture. There is anterior wedging of L1 consistent with another compression fracture which will be described on the dedicated lumbar x-ray. There is no obvious extension into the posterior elements at either level and no inter pedicular widening is identified. No traumatic malalignment. Limited views of the chest are normal. IMPRESSION: 1. 20% loss of anterior height of T8 is consistent with a compression fracture, likely acute. 2. L1 vertebral body compression fracture, likely acute, will be described on the dedicated lumbar x-ray. 3. No traumatic malalignment identified. Electronically Signed   By: Gerome Samavid  Williams III M.D   On: 04/11/2019 13:18   Dg Lumbar Spine 2-3 Views  Result Date: 04/11/2019 CLINICAL DATA:  Fall 15-20 feet yesterday while intoxicated. EXAM: LUMBAR SPINE - 2-3 VIEW COMPARISON:  None. FINDINGS: Moderate compression fracture of the L1 vertebral body with approximately 50% loss of height, with minimal 2 mm bony retropulsion into the anterior lumbar canal. No additional fractures. No spondylolisthesis. No significant degenerative disc disease. No suspicious focal osseous lesions. IMPRESSION: Moderate L1 vertebral body compression fracture with minimal 2 mm bony retropulsion into the anterior lumbar canal, presumably acute. No spondylolisthesis. Electronically Signed   By: Delbert PhenixJason A Poff M.D.   On:  04/11/2019 13:23   Ct Head Wo Contrast  Result Date: 04/11/2019 CLINICAL DATA:  43 year old male with acute headache and neck pain following fall yesterday. Initial encounter. EXAM: CT HEAD WITHOUT CONTRAST CT CERVICAL SPINE WITHOUT CONTRAST TECHNIQUE: Multidetector CT imaging of the head and cervical spine was performed following the standard protocol without intravenous contrast. Multiplanar CT image reconstructions of the cervical spine were also generated. COMPARISON:  04/08/2019 brain MR and 03/23/2019 head CT FINDINGS: CT HEAD FINDINGS Brain: No evidence of acute infarction, hemorrhage, hydrocephalus, extra-axial collection or mass lesion/mass effect. Vascular: No hyperdense vessel or unexpected calcification. Skull: Normal. Negative for fracture or focal lesion. Sinuses/Orbits: No acute abnormality. Small amount of fluid in the RIGHT maxillary sinus is again noted. Other: None. CT CERVICAL SPINE FINDINGS Alignment: Normal. Skull base and vertebrae: No acute fracture. No primary bone lesion or focal pathologic process. Soft tissues and spinal canal: No prevertebral fluid or swelling. No visible canal hematoma. Disc levels:  Unremarkable Upper chest: Negative. Other: None IMPRESSION: 1. No evidence of intracranial abnormality. 2. No static evidence of acute injury to the cervical spine. Electronically Signed   By: Harmon PierJeffrey  Hu M.D.   On: 04/11/2019 12:57   Ct  Cervical Spine Wo Contrast  Result Date: 04/11/2019 CLINICAL DATA:  42 year old male with acute headache and neck pain following fall yesterday. Initial encounter. EXAM: CT HEAD WITHOUT CONTRAST CT CERVICAL SPINE WITHOUT CONTRAST TECHNIQUE: Multidetector CT imaging of the head and cervical spine was performed following the standard protocol without intravenous contrast. Multiplanar CT image reconstructions of the cervical spine were also generated. COMPARISON:  04/08/2019 brain MR and 03/23/2019 head CT FINDINGS: CT HEAD FINDINGS Brain: No evidence  of acute infarction, hemorrhage, hydrocephalus, extra-axial collection or mass lesion/mass effect. Vascular: No hyperdense vessel or unexpected calcification. Skull: Normal. Negative for fracture or focal lesion. Sinuses/Orbits: No acute abnormality. Small amount of fluid in the RIGHT maxillary sinus is again noted. Other: None. CT CERVICAL SPINE FINDINGS Alignment: Normal. Skull base and vertebrae: No acute fracture. No primary bone lesion or focal pathologic process. Soft tissues and spinal canal: No prevertebral fluid or swelling. No visible canal hematoma. Disc levels:  Unremarkable Upper chest: Negative. Other: None IMPRESSION: 1. No evidence of intracranial abnormality. 2. No static evidence of acute injury to the cervical spine. Electronically Signed   By: Margarette Canada M.D.   On: 04/11/2019 12:57    ____________________________________________   PROCEDURES  Procedure(s) performed (including Critical Care):  Procedures   ____________________________________________   INITIAL IMPRESSION / ASSESSMENT AND PLAN / ED COURSE  As part of my medical decision making, I reviewed the following data within the electronic MEDICAL RECORD NUMBER Notes from prior ED visits and [place] Controlled Substance Database  ----------------------------------------- 2:18 PM on 04/11/2019 ----------------------------------------- 43 year old male presents to the ED via EMS after falling after climbing a fence.  Patient states that he fell approximately 15 to 20 feet.  He states that he been drinking alcohol.  Since his fall he has been taking ibuprofen without relief.  CT head and cervical spine was done and was reported as negative.  Thoracic spine x-ray showed T8 compression fracture at approximately 20%.  Lumbar spine x-ray showed L1 at approximately 50% compression with bony retropulsion at a minimum of 2 mm.  Patient was made aware and that this provider will need to get a CT scan or MRI to evaluate this more fully.   Patient became extremely angry stating that he wanted to go no matter what.  There was persistent profane language.  Patient states he understands his reports and will deal with that later.  He was strongly advised to follow-up with neurosurgery.  Patient was discharged on Norco 1 every 6 hours as needed pain.  He is return to the emergency department if any worsening of his symptoms or urgent concerns.  Patient left AMA.   ____________________________________________   FINAL CLINICAL IMPRESSION(S) / ED DIAGNOSES  Final diagnoses:  Compression fracture of T8 vertebra, initial encounter ([place])  Compression fracture of L1 vertebra, initial encounter ([place])  Multiple abrasions     ED Discharge Orders         Ordered    HYDROcodone-acetaminophen (NORCO/VICODIN) 5-325 MG tablet  Every 6 hours PRN     04/11/19 1355           Note:  This document was prepared using Dragon voice recognition software and may include unintentional dictation errors.    Johnn Hai, PA-C 04/11/19 1623    Harvest Dark, MD 04/14/19 [phone]

## 2019-04-11 NOTE — Discharge Instructions (Signed)
Call the neurosurgeon listed on your discharge papers and make an appointment for follow-up.  Take medication only as directed.  Medication for pain should not be mixed with alcohol.  Watch her abrasions for any signs of infection.  Return to the emergency department if any severe worsening of your symptoms.

## 2019-04-11 NOTE — ED Triage Notes (Signed)
First Nurse note:  10 foot fall yesterday while climbing over a fence.  States he was intoxicated at time of injury.  No LOC.  ARrives via EMS.

## 2019-04-13 ENCOUNTER — Other Ambulatory Visit: Payer: Self-pay | Admitting: Physician Assistant

## 2019-04-13 DIAGNOSIS — S22060A Wedge compression fracture of T7-T8 vertebra, initial encounter for closed fracture: Secondary | ICD-10-CM

## 2019-04-25 ENCOUNTER — Other Ambulatory Visit: Payer: Self-pay

## 2019-04-25 ENCOUNTER — Emergency Department
Admission: EM | Admit: 2019-04-25 | Discharge: 2019-04-25 | Disposition: A | Discharge status: Home / Self Care | Payer: Managed Care, Other (non HMO) | Attending: Emergency Medicine | Admitting: Emergency Medicine

## 2019-04-25 ENCOUNTER — Encounter: Payer: Self-pay | Admitting: Physician Assistant

## 2019-04-25 DIAGNOSIS — F17290 Nicotine dependence, other tobacco product, uncomplicated: Secondary | ICD-10-CM | POA: Insufficient documentation

## 2019-04-25 DIAGNOSIS — Z20828 Contact with and (suspected) exposure to other viral communicable diseases: Secondary | ICD-10-CM | POA: Diagnosis present

## 2019-04-25 DIAGNOSIS — Z20822 Contact with and (suspected) exposure to covid-19: Secondary | ICD-10-CM

## 2019-04-25 LAB — SARS CORONAVIRUS 2 BY RT PCR (HOSPITAL ORDER, PERFORMED IN ~~LOC~~ HOSPITAL LAB): SARS Coronavirus 2: NEGATIVE

## 2019-04-25 NOTE — ED Notes (Signed)
Pt denies fever, cough, N/D at this time.

## 2019-04-25 NOTE — ED Triage Notes (Signed)
Patient reports his girlfriend's mother and sister tested positive and he would like to be tested. Does not vocalized any symptoms.

## 2019-04-25 NOTE — Discharge Instructions (Addendum)
You will only be notified of Positive results via phone. Results will be available in 24 hours. You should activate and review MyChart for test results.

## 2019-04-25 NOTE — ED Notes (Signed)
Pt states he has back pain but does not want to be seen for that today.

## 2019-04-25 NOTE — ED Provider Notes (Signed)
[facility] Emergency Department Provider Note ____________________________________________  Time seen: 2029  I have reviewed the triage vital signs and the nursing notes.  HISTORY  Chief Complaint  Wants COVID test  HPI Steven Smith is a 43 y.o. male presents to the ED accompanied by his girlfriend, with request for coronavirus test.  Patient describes his girlfriend, whom he has had close contact, lives with her mother who tested positive for COVID in the past.  Patient's denies any symptoms at this time.  He reports this is his third COVID test since the pandemic.  He denies any previous positive test results.   History reviewed. No pertinent past medical history.  There are no active problems to display for this patient.  History reviewed. No pertinent surgical history.  Prior to Admission medications   Medication Sig Start Date End Date Taking? Authorizing Provider  HYDROcodone-acetaminophen (NORCO/VICODIN) 5-325 MG tablet Take 1 tablet by mouth every 6 (six) hours as needed for moderate pain. 04/11/19   Johnn Hai, PA-C    Allergies Patient has no known allergies.  No family history on file.  Social History Social History   Tobacco Use  . Smoking status: Current Every Day Smoker    Types: Cigars  . Smokeless tobacco: Current User  Substance Use Topics  . Alcohol use: Yes    Alcohol/week: 7.0 standard drinks    Types: 7 Shots of liquor per week  . Drug use: Not on file    Review of Systems  Constitutional: Negative for fever. Eyes: Negative for visual changes. ENT: Negative for sore throat. Cardiovascular: Negative for chest pain. Respiratory: Negative for shortness of breath. Gastrointestinal: Negative for abdominal pain, vomiting and diarrhea. Genitourinary: Negative for dysuria. Musculoskeletal: Negative for back pain. Skin: Negative for rash. Neurological: Negative for headaches, focal weakness or  numbness. ____________________________________________  PHYSICAL EXAM:  VITAL SIGNS: ED Triage Vitals [04/25/19 1947]  Enc Vitals Group     BP      Pulse      Resp      Temp      Temp src      SpO2      Weight 153 lb (69.4 kg)     Height 5\' 9"  (1.753 m)     Head Circumference      Peak Flow      Pain Score 7     Pain Loc      Pain Edu?      Excl. in [place]?     Constitutional: Alert and oriented. Well appearing and in no distress. Head: Normocephalic and atraumatic. Eyes: Conjunctivae are normal. PERRL. Normal extraocular movements Cardiovascular: Normal rate, regular rhythm. Normal distal pulses. Respiratory: Normal respiratory effort. No wheezes/rales/rhonchi. Musculoskeletal: Nontender with normal range of motion in all extremities.  Neurologic:  Normal gait without ataxia. Normal speech and language. No gross focal neurologic deficits are appreciated. Skin:  Skin is warm, dry and intact. No rash noted. Psychiatric: Mood and affect are normal. Patient exhibits appropriate insight and judgment. ____________________________________________   LABS (pertinent positives/negatives) Labs Reviewed  SARS CORONAVIRUS 2 (HOSPITAL ORDER, [facility])  ____________________________________________  PROCEDURES  Procedures ____________________________________________  INITIAL IMPRESSION / ASSESSMENT AND PLAN / ED COURSE  Patient with ED evaluation and request for screening test for COVID.  He reports contact with his girlfriend, who has a first-degree contact who recently tested positive.  Patient is without symptoms at this time.  He will remain at home under quarantine  orders until his results are available.  Baird LyonsCasey Zaun was evaluated in Emergency Department on 04/25/2019 for the symptoms described in the history of present illness. He was evaluated in the context of the global COVID-19 pandemic, which necessitated consideration that the patient might be  at risk for infection with the SARS-CoV-2 virus that causes COVID-19. Institutional protocols and algorithms that pertain to the evaluation of patients at risk for COVID-19 are in a state of rapid change based on information released by regulatory bodies including the CDC and federal and state organizations. These policies and algorithms were followed during the patient's care in the ED. ____________________________________________  FINAL CLINICAL IMPRESSION(S) / ED DIAGNOSES  Final diagnoses:  Exposure to Covid-19 Virus      Malak Duchesneau, Charlesetta IvoryJenise V Bacon, PA-C 04/25/19 2056    Jene EveryKinner, Robert, MD 04/25/19 2115

## 2019-04-28 ENCOUNTER — Ambulatory Visit
Admission: RE | Admit: 2019-04-28 | Discharge: 2019-04-28 | Disposition: A | Discharge status: Home / Self Care | Payer: Managed Care, Other (non HMO) | Source: Ambulatory Visit | Attending: Physician Assistant | Admitting: Physician Assistant

## 2019-04-28 ENCOUNTER — Other Ambulatory Visit: Payer: Self-pay

## 2019-04-28 DIAGNOSIS — S22060A Wedge compression fracture of T7-T8 vertebra, initial encounter for closed fracture: Secondary | ICD-10-CM | POA: Insufficient documentation

## 2019-04-28 IMAGING — MR MR LUMBAR SPINE W/O CM
5 of 6 series · 33 of 48 positions shown · non-contrast
Comparison: None.

CLINICAL DATA: Fall, mid to lower back pain

EXAM:
MRI LUMBAR SPINE WITHOUT CONTRAST
TECHNIQUE: Multiplanar, multisequence MR imaging of the lumbar spine was
performed. No intravenous contrast was administered.

[Series 5: T2 · sagittal · 4.0mm · 1.02mm/px · 6 of 15 slices shown (1 of 2)]
[im 1/15]
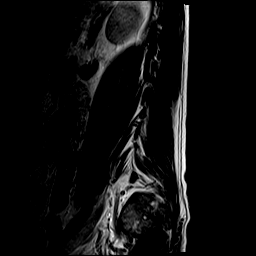
[im 3/15]
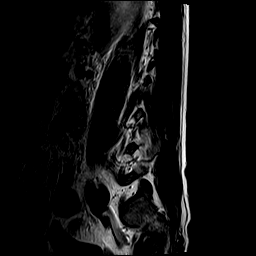
[im 6/15]
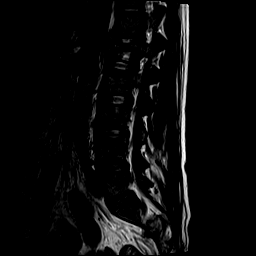
[im 9/15]
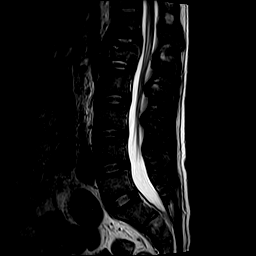
[im 12/15]
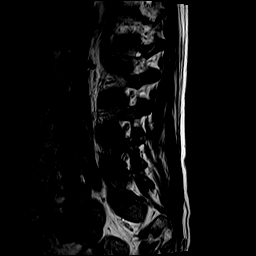
[im 15/15]
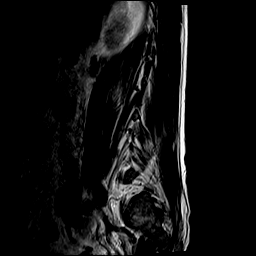

[Series 6: T1 · sagittal · 4.0mm · 1.02mm/px · 6 of 15 slices shown (1 of 2)]
[im 1/15]
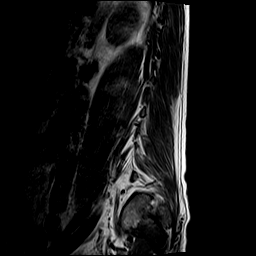
[im 3/15]
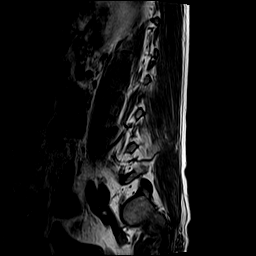
[im 6/15]
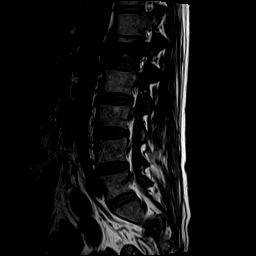
[im 9/15]
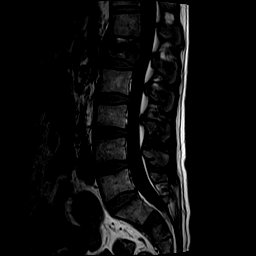
[im 12/15]
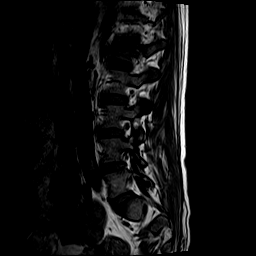
[im 15/15]
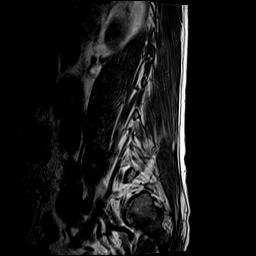

[Series 7: STIR · sagittal · 4.0mm · 0.51mm/px · 3 of 15 slices shown]
[im 1/15]
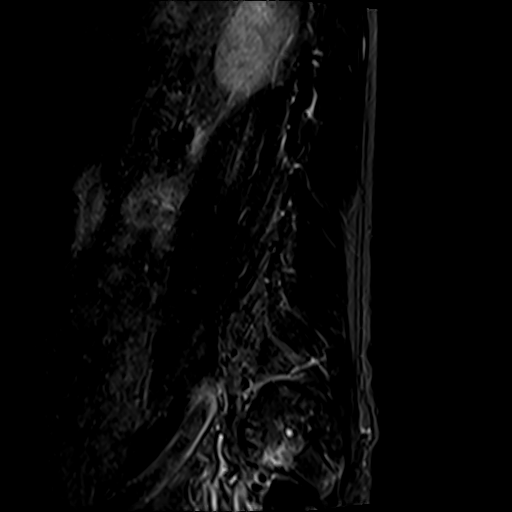
[im 4/15]
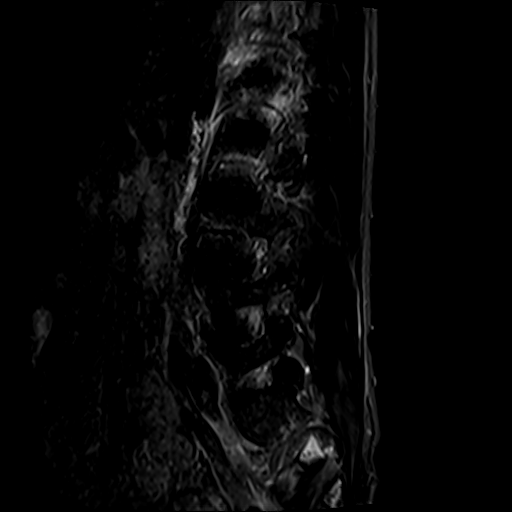
[im 8/15]
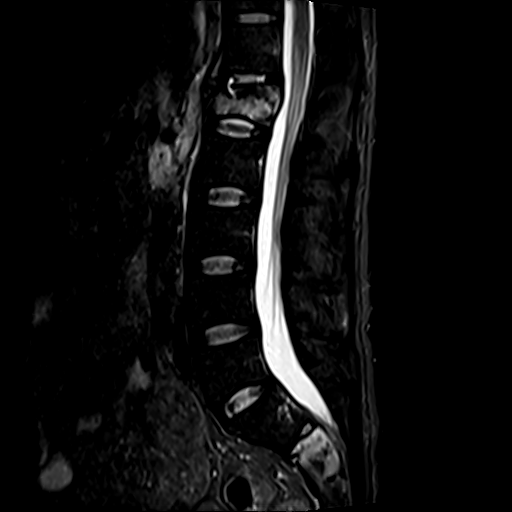

[Series 8: T2 · axial · 4.0mm · 0.78mm/px · z∈[-88,+111]mm · 9 of 36 slices shown (2 of 2)]
[im 1/36]
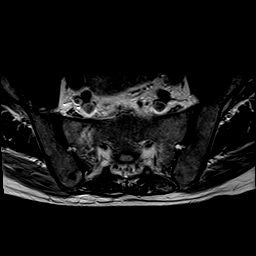
[im 6/36]
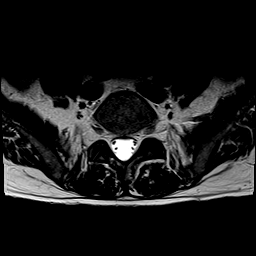
[im 12/36]
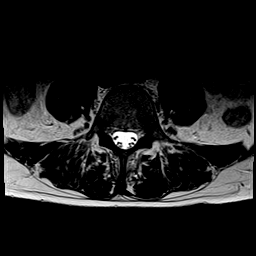
[im 15/36]
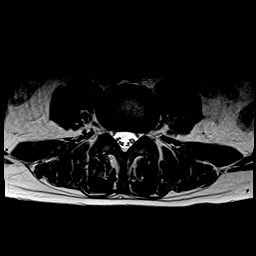
[im 18/36]
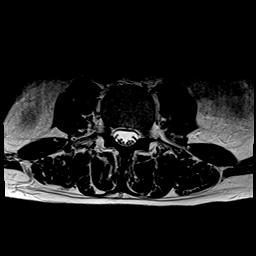
[im 21/36]
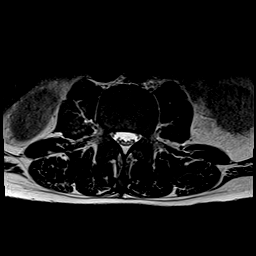
[im 24/36]
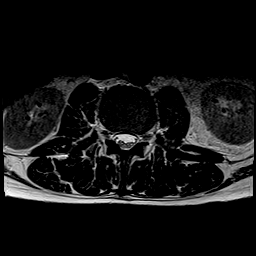
[im 30/36]
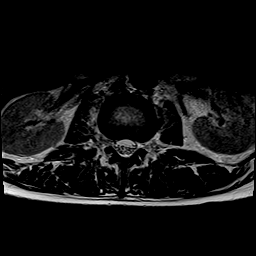
[im 36/36]
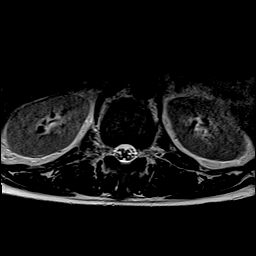

[Series 10: T1 · axial · 4.0mm · 0.39mm/px · z∈[-88,+111]mm · 9 of 36 slices shown (2 of 2)]
[im 1/36]
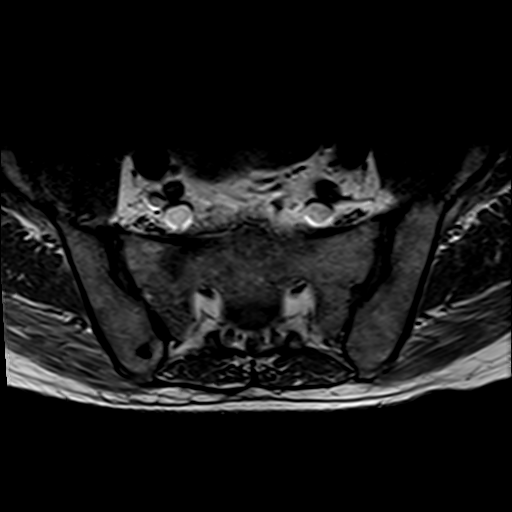
[im 6/36]
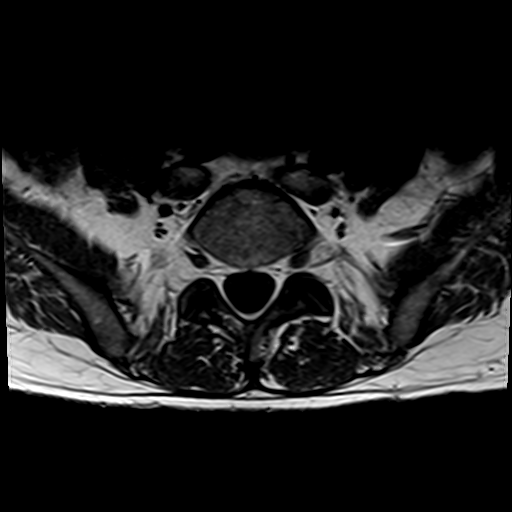
[im 12/36]
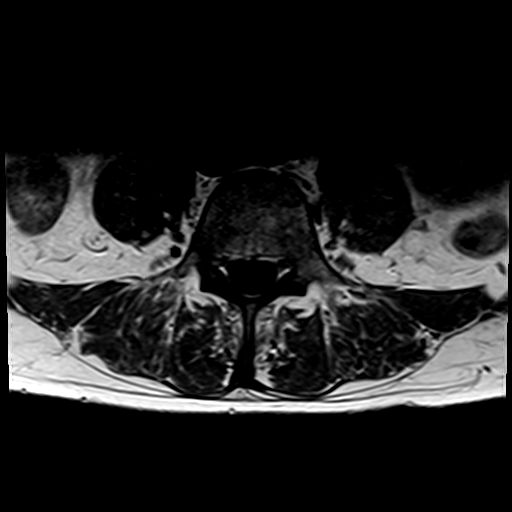
[im 15/36]
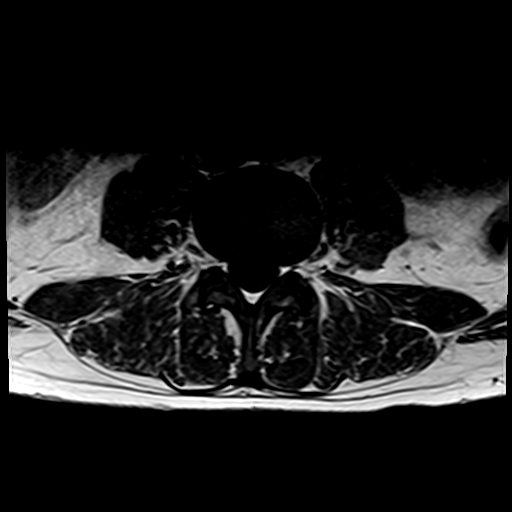
[im 18/36]
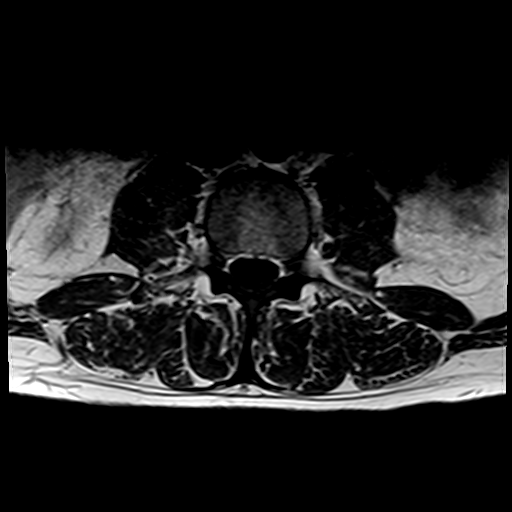
[im 21/36]
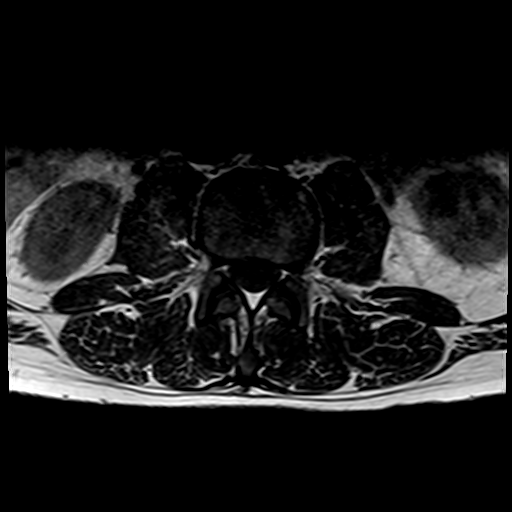
[im 24/36]
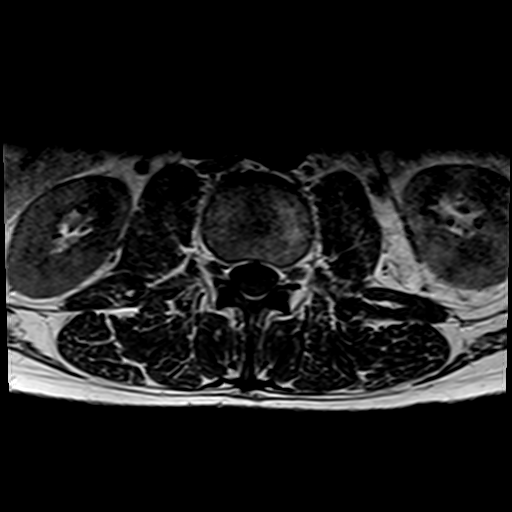
[im 30/36]
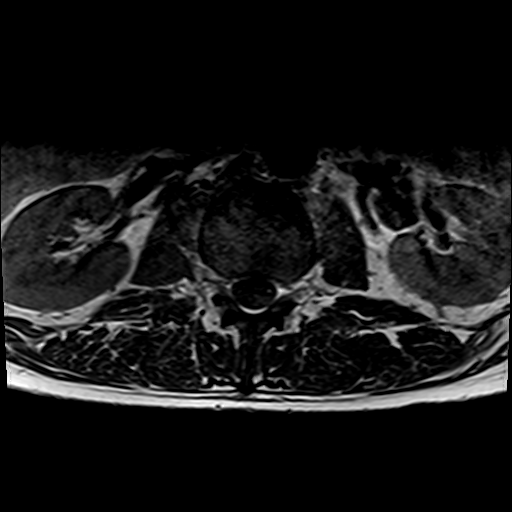
[im 36/36]
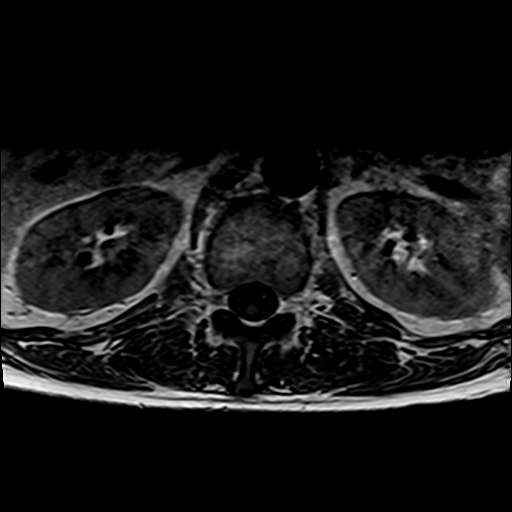

[33 of 48 positions shown; findings below may reference images not displayed]

FINDINGS: Segmentation: There are 5 non-rib bearing lumbar type vertebral
bodies with the last intervertebral disc space labeled as L5-S1.

Alignment:  Normal

Vertebrae: There is a superior compression fracture of the L1
vertebral body with approximately 30% loss in superior vertebral
body height. There is edema at extending into the bilateral
pedicles. There is a small amount of fluid seen within the anterior
vertebral body fracture fragments. There is buckling of the anterior
superior cortex and slightly of the posterior cortex. No
retropulsion of fragments is seen. There is prevertebral soft tissue
swelling seen at the L1 level. No epidural collections. There is
also a nondisplaced fracture that is partially visualized of the
right S1-S2 sacral and low with significant surrounding marrow
edema.

Conus medullaris and cauda equina: Conus extends to the L1 level.
Conus and cauda equina appear normal.

Paraspinal and other soft tissues: There is mild edema seen within
the interspinous ligaments at T12-L1 and L1-L2. The retroperitoneal
structures appear to be intact.

Disc levels:

There is buckling of the posterosuperior cortex at L1 causing mild
effacement anterior thecal sac at T12-L1. No significant canal or
neural foraminal narrowing however is noted.
IMPRESSION: 1. Acute L1 superior vertebral body compression fracture with
approximately 30% loss of height and slight buckling of the cortex.
No retropulsion of fragments or epidural collections.
2. Edema is seen within the interspinous ligaments at T12-L1 and
L1-L2.
3. Nondisplaced fracture of the right S1-S2 sacral ala with
significant surrounding marrow edema.

These results will be called to the ordering clinician or
representative by the Radiologist Assistant, and communication
documented in the PACS or zVision Dashboard.

## 2019-04-28 IMAGING — MR MR THORACIC SPINE W/O CM
6 series · 29 of 48 positions shown · non-contrast
Comparison: None.

CLINICAL DATA: Climbing a 10 foot fence, and fall

EXAM:
MRI THORACIC SPINE WITHOUT CONTRAST
TECHNIQUE: Multiplanar, multisequence MR imaging of the thoracic spine was
performed. No intravenous contrast was administered.

[Series 16: T1 · sagittal · 5.0mm · 1.88mm/px · 2 of 9 slices shown (1 of 2)]
[im 1/9]
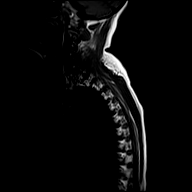
[im 9/9]
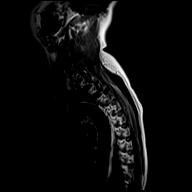

[Series 17: T2 · sagittal · 3.0mm · 1.06mm/px · 6 of 17 slices shown (1 of 2)]
[im 1/17]
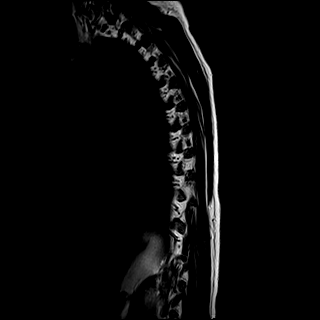
[im 4/17]
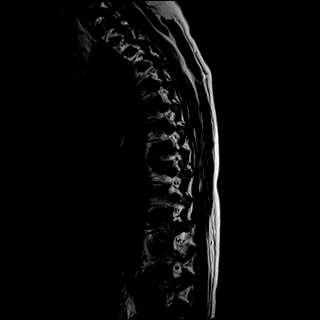
[im 7/17]
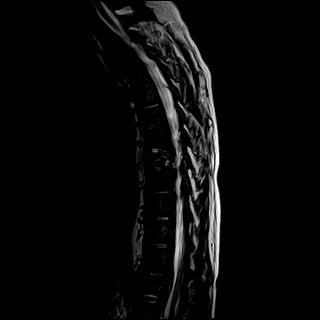
[im 10/17]
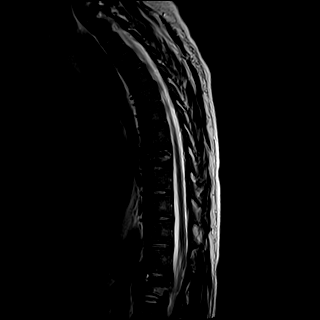
[im 13/17]
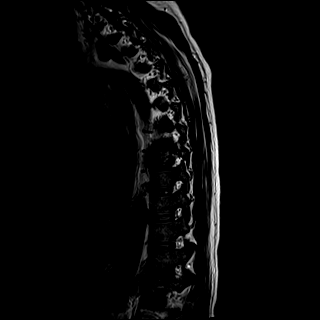
[im 17/17]
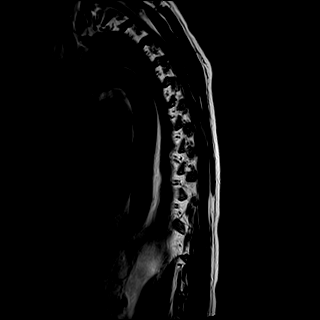

[Series 18: T1 · sagittal · 3.0mm · 1.06mm/px · 6 of 17 slices shown (2 of 2)]
[im 1/17]
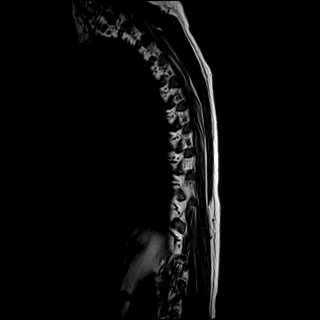
[im 4/17]
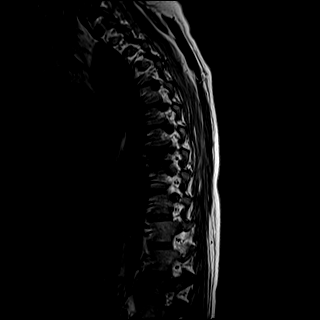
[im 7/17]
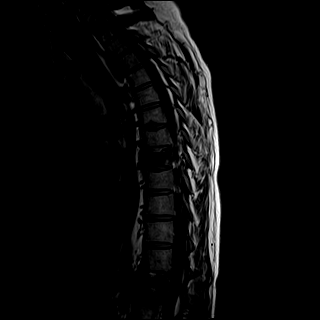
[im 10/17]
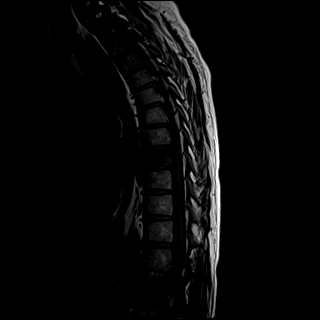
[im 13/17]
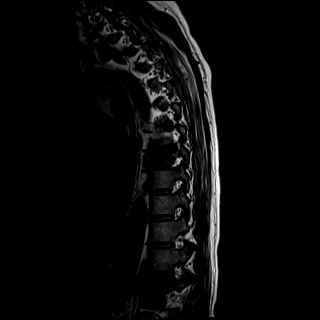
[im 17/17]
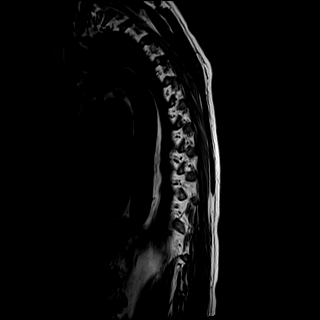

[Series 19: STIR · sagittal · 3.0mm · 0.53mm/px · 6 of 17 slices shown]
[im 1/17]
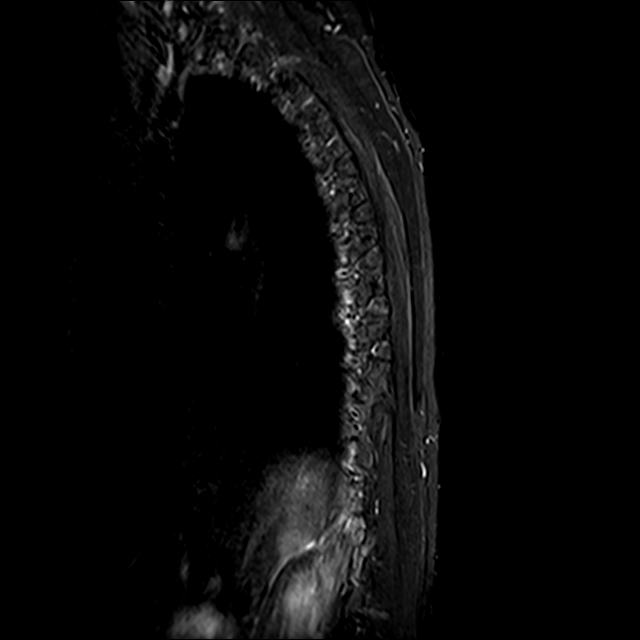
[im 4/17]
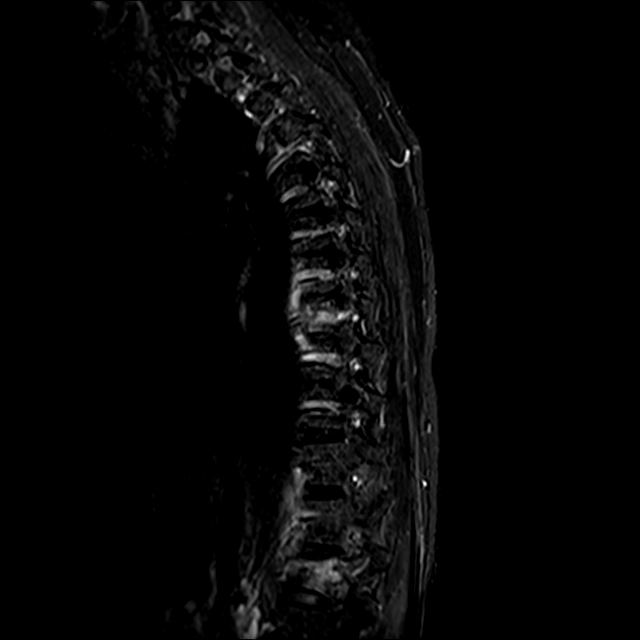
[im 7/17]
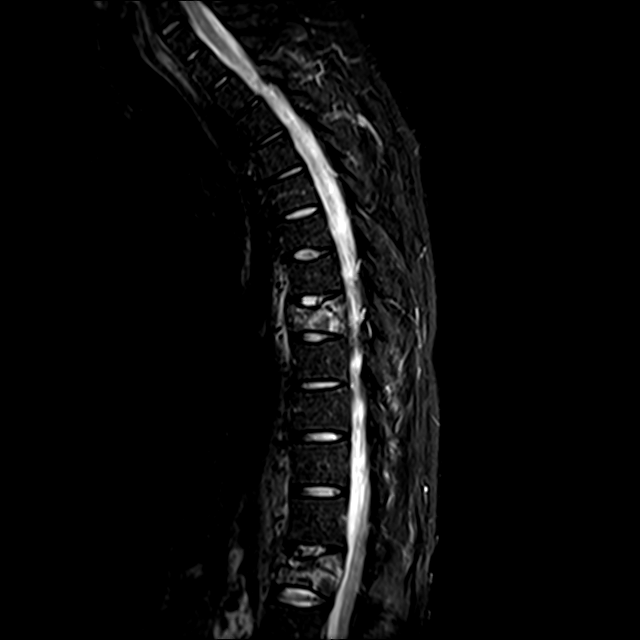
[im 10/17]
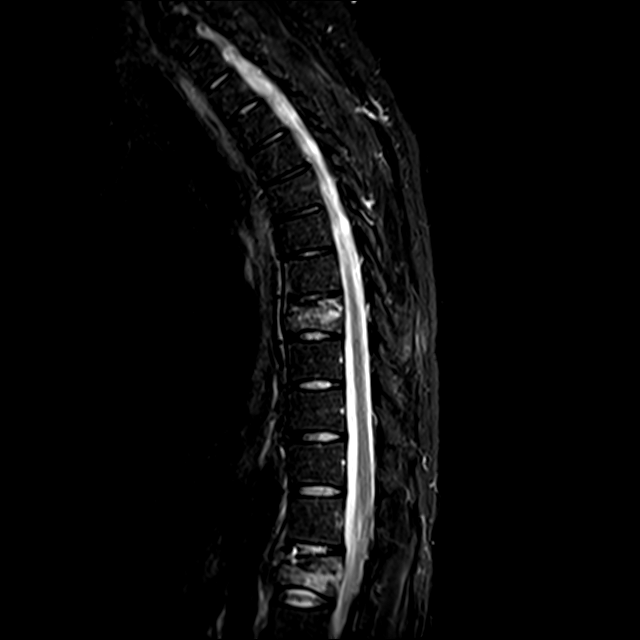
[im 13/17]
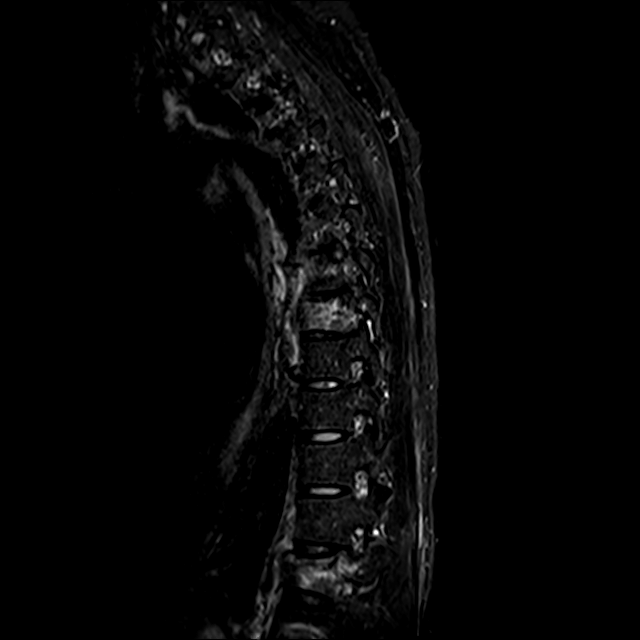
[im 17/17]
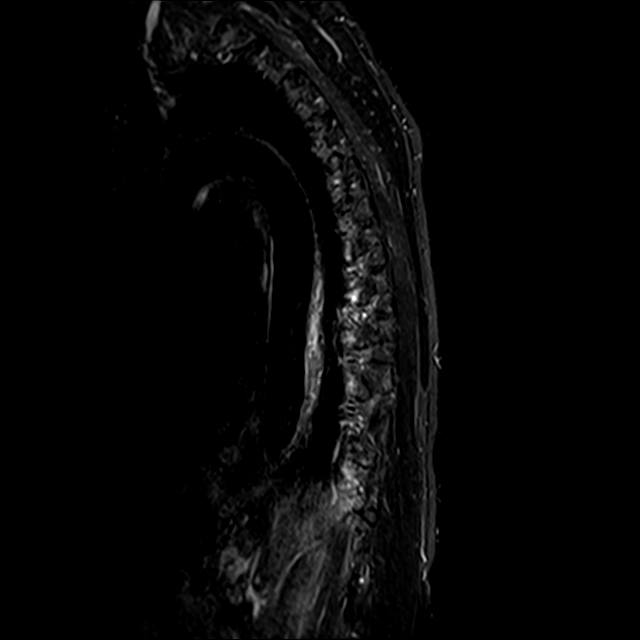

[Series 20: T2 · axial · 4.0mm · 0.59mm/px · z∈[-217,-10]mm · 8 of 40 slices shown (2 of 2)]
[im 1/40]
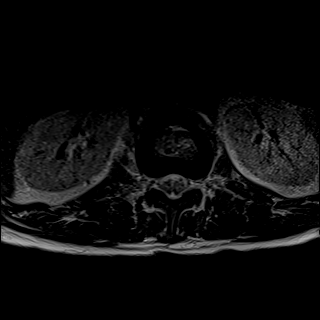
[im 7/40]
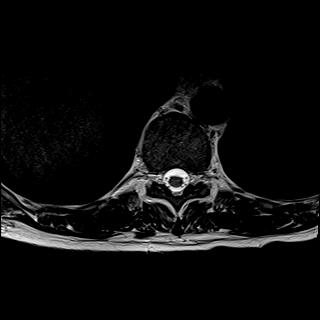
[im 13/40]
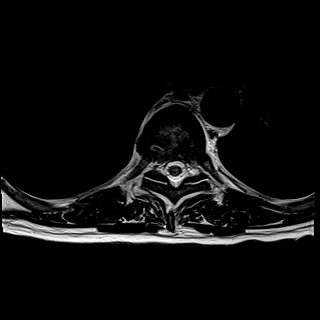
[im 19/40]
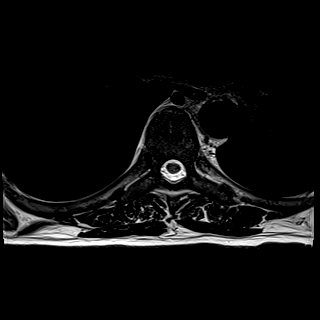
[im 22/40]
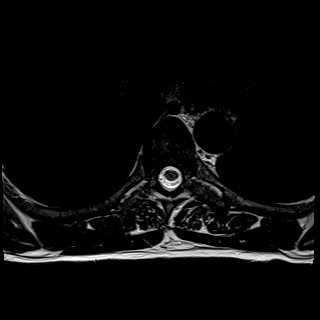
[im 28/40]
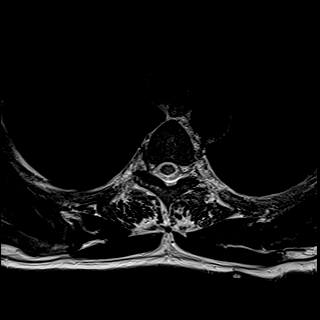
[im 34/40]
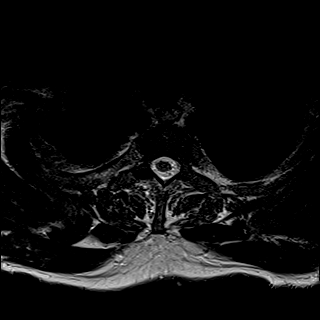
[im 40/40]
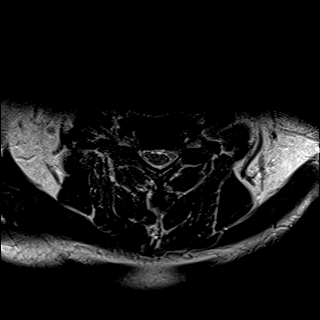

[Series 21: GRE · axial · 4.0mm · 0.37mm/px · 1 of 40 slices shown]
[im 1/40]
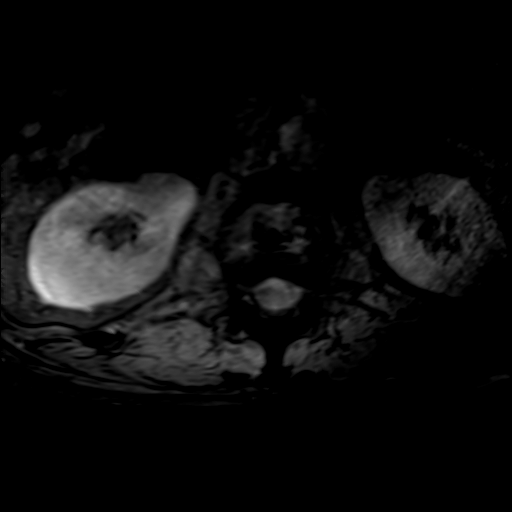

[29 of 48 positions shown; findings below may reference images not displayed]

FINDINGS: Alignment: Normal

Vertebrae: There is superior compression fracture of the T9
vertebral body with approximately 30% loss in vertebral body height.
There is marrow edema seen extending into the posterior bilateral
pedicles. No retropulsion of fragments. There is also a superior
compression fracture of the L1 vertebral body with approximately 30%
loss in superior vertebral body height. There is edema seen
extending into the bilateral pedicles. There is buckling of the
anterior superior cortex. A small amount of fluid is seen within the
T12-L1 inter disc space. No retropulsion of fragments is seen.

Cord: Normal in signal and morphology.

Paraspinal and other soft tissues: Prevertebral soft tissue swelling
is seen at T8 and L1. No epidural hematoma however is noted. No soft
tissue fluid collection. There is mild edema seen within the inter
spinous ligaments at T9-T10 and T12-L1. The retroperitoneal
structures are otherwise intact.

Disc levels: At T7-T8 there is no significant buckling of the
posterior cortex and no significant canal or neural foraminal
narrowing. At T12-L1 there is mild buckling of the posterior L1
superior cortex, however no significant canal or neural foraminal
narrowing.
IMPRESSION: Acute superior compression fractures of T9 and L1 vertebral bodies
with approximately 25-30% loss in vertebral body height and
significant surrounding marrow edema into the bilateral pedicles. No
retropulsion of fragments or epidural collections.

Interspinous ligamentous edema at T9-T10 and T12-L1.

These results will be called to the ordering clinician or
representative by the Radiologist Assistant, and communication
documented in the PACS or zVision Dashboard.

## 2019-05-03 ENCOUNTER — Emergency Department
Admission: EM | Admit: 2019-05-03 | Discharge: 2019-05-03 | Disposition: A | Discharge status: Home / Self Care | Payer: Managed Care, Other (non HMO) | Attending: Emergency Medicine | Admitting: Emergency Medicine

## 2019-05-03 ENCOUNTER — Other Ambulatory Visit: Payer: Self-pay

## 2019-05-03 DIAGNOSIS — Z20828 Contact with and (suspected) exposure to other viral communicable diseases: Secondary | ICD-10-CM | POA: Diagnosis not present

## 2019-05-03 DIAGNOSIS — Z20822 Contact with and (suspected) exposure to covid-19: Secondary | ICD-10-CM

## 2019-05-03 DIAGNOSIS — Z720 Tobacco use: Secondary | ICD-10-CM | POA: Insufficient documentation

## 2019-05-03 NOTE — ED Provider Notes (Signed)
[facility] Emergency Department Provider Note  ____________________________________________  Time seen: Approximately 6:19 AM  I have reviewed the triage vital signs and the nursing notes.   HISTORY  Chief Complaint COVID Test   HPI Steven Smith is a 43 y.o. 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  Patient reports that he has been tested 3 times previously for COVID and they all came back negative.  A week ago he was tested for the last time together with a friend who spent 4 days with him in his house.  His swab was negative but his friend tested positive.  Patient is supposed to see his older parents later this week and he was hoping to get tested again before seeing them.  Patient denies any symptoms such as loss of taste or smell, fever, cough, shortness of breath, chest pain, sore throat, body aches, vomiting or diarrhea.   PMH Seizure  Prior to Admission medications   Medication Sig Start Date End Date Taking? Authorizing Provider  HYDROcodone-acetaminophen (NORCO/VICODIN) 5-325 MG tablet Take 1 tablet by mouth every 6 (six) hours as needed for moderate pain. 04/11/19   Tommi RumpsSummers, Rhonda L, PA-C    Allergies Patient has no known allergies.  No family history on file.  Social History Social History   Tobacco Use   Smoking status: Current Every Day Smoker    Types: Cigars   Smokeless tobacco: Current User  Substance Use Topics   Alcohol use: Yes    Alcohol/week: 7.0 standard drinks    Types: 7 Shots of liquor per week   Drug use: Not on file    Review of Systems  Constitutional: Negative for fever. Eyes: Negative for visual changes. ENT: Negative for sore throat. Neck: No neck pain  Cardiovascular: Negative for chest pain. Respiratory: Negative for shortness of breath. Gastrointestinal: Negative for abdominal pain, vomiting or diarrhea. Genitourinary: Negative for dysuria. Musculoskeletal: Negative for back  pain. Skin: Negative for rash. Neurological: Negative for headaches, weakness or numbness. Psych: No SI or HI  ____________________________________________   PHYSICAL EXAM:  VITAL SIGNS: ED Triage Vitals  Enc Vitals Group     BP 05/03/19 0441 (!) 152/119     Pulse Rate 05/03/19 0441 (!) 107     Resp 05/03/19 0441 19     Temp 05/03/19 0441 98.4 F (36.9 C)     Temp Source 05/03/19 0441 Oral     SpO2 05/03/19 0441 97 %     Weight 05/03/19 0439 153 lb (69.4 kg)     Height 05/03/19 0439 5\' 9"  (1.753 m)     Head Circumference --      Peak Flow --      Pain Score 05/03/19 0439 6     Pain Loc --      Pain Edu? --      Excl. in GC? --     Constitutional: Alert and oriented. Well appearing and in no apparent distress. HEENT:      Head: Normocephalic and atraumatic.         Eyes: Conjunctivae are normal. Sclera is non-icteric.       Mouth/Throat: Mucous membranes are moist.       Neck: Supple with no signs of meningismus. Cardiovascular: Regular rate and rhythm. No murmurs, gallops, or rubs. 2+ symmetrical distal pulses are present in all extremities. No JVD. Respiratory: Normal respiratory effort. Lungs are clear to auscultation bilaterally. No wheezes, crackles, or rhonchi.  Gastrointestinal: Soft, non tender,  and non distended with positive bowel sounds. No rebound or guarding. Musculoskeletal: Nontender with normal range of motion in all extremities. No edema, cyanosis, or erythema of extremities. Neurologic: Normal speech and language. Face is symmetric. Moving all extremities. No gross focal neurologic deficits are appreciated. Skin: Skin is warm, dry and intact. No rash noted. Psychiatric: Mood and affect are normal. Speech and behavior are normal.  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  Patient is asymptomatic.  COVID swab was sent.  Discussed quarantine until results are back.  Discussed quarantining case test comes back positive.  Discussed my standard return precautions.       As part of my medical decision making, I reviewed the following data within the [place] notes reviewed and incorporated, Old chart reviewed, Notes from prior ED visits and [place] Controlled Substance Database   Patient was evaluated in Emergency Department today for the symptoms described in the history of present illness. Patient was evaluated in the context of the global COVID-19 pandemic, which necessitated consideration that the patient might be at risk for infection with the SARS-CoV-2 virus that causes COVID-19. Institutional protocols and algorithms that pertain to the evaluation of patients at risk for COVID-19 are in a state of rapid change based on information released by regulatory bodies including the CDC and federal and state organizations. These policies and algorithms were followed during the patient's care in the ED.   ____________________________________________   FINAL CLINICAL IMPRESSION(S) / ED DIAGNOSES   Final diagnoses:  Exposure to Covid-19 Virus      NEW MEDICATIONS STARTED DURING THIS VISIT:  ED Discharge Orders    None       Note:  This document was prepared using Dragon voice recognition software and may include unintentional dictation errors.    Alfred Levins, [place], MD 05/03/19 [phone]

## 2019-05-03 NOTE — ED Triage Notes (Addendum)
Patient reports he was exposed to [place]. He was not symptomatic, but  test on Sunday  with negative results. Patient continues to be unsymptomatic, however, would like to be tested again to make sure before he sees his father on Tuesday.   Patient also reports chronic back pain.

## 2019-05-03 NOTE — Discharge Instructions (Signed)

## 2019-05-03 NOTE — ED Notes (Signed)
Pt reports he had negative COVID test last week but has been around someone who is positive. Pt also reports he has been around his parents and is concerned if he has it he will pass to them. Pt denies any symptoms.

## 2019-05-04 LAB — NOVEL CORONAVIRUS, NAA (HOSP ORDER, SEND-OUT TO REF LAB; TAT 18-24 HRS): SARS-CoV-2, NAA: NOT DETECTED
